# Patient Record
Sex: Female | Born: 1935 | Race: White | Hispanic: No | Smoking: Never smoker
Health system: Southern US, Community
[De-identification: ages and names within clinical notes are randomized; demographics above are authoritative.]

## PROBLEM LIST (undated history)

## (undated) DIAGNOSIS — E7800 Pure hypercholesterolemia, unspecified: Secondary | ICD-10-CM

## (undated) DIAGNOSIS — M549 Dorsalgia, unspecified: Secondary | ICD-10-CM

## (undated) DIAGNOSIS — F419 Anxiety disorder, unspecified: Secondary | ICD-10-CM

## (undated) DIAGNOSIS — M25569 Pain in unspecified knee: Secondary | ICD-10-CM

## (undated) DIAGNOSIS — G894 Chronic pain syndrome: Secondary | ICD-10-CM

## (undated) DIAGNOSIS — F329 Major depressive disorder, single episode, unspecified: Secondary | ICD-10-CM

## (undated) DIAGNOSIS — F32A Depression, unspecified: Secondary | ICD-10-CM

## (undated) DIAGNOSIS — K219 Gastro-esophageal reflux disease without esophagitis: Secondary | ICD-10-CM

## (undated) DIAGNOSIS — F0390 Unspecified dementia without behavioral disturbance: Secondary | ICD-10-CM

## (undated) DIAGNOSIS — M1990 Unspecified osteoarthritis, unspecified site: Secondary | ICD-10-CM

## (undated) DIAGNOSIS — I10 Essential (primary) hypertension: Secondary | ICD-10-CM

## (undated) DIAGNOSIS — R110 Nausea: Secondary | ICD-10-CM

## (undated) HISTORY — PX: TOTAL HIP ARTHROPLASTY: SHX124

## (undated) HISTORY — PX: JOINT REPLACEMENT: SHX530

---

## 2005-01-15 ENCOUNTER — Ambulatory Visit: Payer: Self-pay | Admitting: Family Medicine

## 2005-04-15 ENCOUNTER — Emergency Department: Payer: Self-pay | Admitting: Unknown Physician Specialty

## 2005-04-15 ENCOUNTER — Other Ambulatory Visit: Payer: Self-pay

## 2006-03-29 ENCOUNTER — Ambulatory Visit: Payer: Self-pay | Admitting: Family Medicine

## 2007-06-26 ENCOUNTER — Ambulatory Visit: Payer: Self-pay | Admitting: Family Medicine

## 2007-07-25 ENCOUNTER — Ambulatory Visit: Payer: Self-pay | Admitting: Family Medicine

## 2009-02-18 ENCOUNTER — Ambulatory Visit: Payer: Self-pay | Admitting: Family Medicine

## 2009-04-16 ENCOUNTER — Ambulatory Visit: Payer: Self-pay | Admitting: Family Medicine

## 2009-12-19 ENCOUNTER — Ambulatory Visit: Payer: Self-pay | Admitting: Internal Medicine

## 2010-07-03 ENCOUNTER — Ambulatory Visit: Payer: Self-pay | Admitting: Family Medicine

## 2010-07-11 ENCOUNTER — Ambulatory Visit: Payer: Self-pay | Admitting: Internal Medicine

## 2012-03-30 ENCOUNTER — Ambulatory Visit: Payer: Self-pay | Admitting: Family Medicine

## 2012-04-28 ENCOUNTER — Ambulatory Visit: Payer: Self-pay | Admitting: Family Medicine

## 2013-05-12 ENCOUNTER — Ambulatory Visit: Payer: Self-pay | Admitting: Family Medicine

## 2013-07-19 ENCOUNTER — Ambulatory Visit: Payer: Self-pay | Admitting: Family Medicine

## 2013-07-27 ENCOUNTER — Ambulatory Visit: Payer: Self-pay | Admitting: Family Medicine

## 2018-02-08 ENCOUNTER — Other Ambulatory Visit: Payer: Self-pay | Admitting: Family Medicine

## 2018-02-08 DIAGNOSIS — N950 Postmenopausal bleeding: Secondary | ICD-10-CM

## 2018-02-09 ENCOUNTER — Ambulatory Visit
Admission: RE | Admit: 2018-02-09 | Discharge: 2018-02-09 | Disposition: A | Discharge status: Home / Self Care | Payer: Medicare HMO | Source: Ambulatory Visit | Attending: Family Medicine | Admitting: Family Medicine

## 2018-02-09 ENCOUNTER — Encounter (INDEPENDENT_AMBULATORY_CARE_PROVIDER_SITE_OTHER): Payer: Self-pay

## 2018-02-09 DIAGNOSIS — N950 Postmenopausal bleeding: Secondary | ICD-10-CM | POA: Diagnosis not present

## 2018-05-09 ENCOUNTER — Encounter: Payer: Self-pay | Admitting: Emergency Medicine

## 2018-05-09 ENCOUNTER — Ambulatory Visit: Admit: 2018-05-09 | Payer: Medicare HMO

## 2018-05-09 ENCOUNTER — Other Ambulatory Visit: Payer: Self-pay

## 2018-05-09 ENCOUNTER — Ambulatory Visit
Admission: EM | Admit: 2018-05-09 | Discharge: 2018-05-09 | Disposition: A | Discharge status: Home / Self Care | Payer: Medicare HMO | Attending: Family Medicine | Admitting: Family Medicine

## 2018-05-09 ENCOUNTER — Ambulatory Visit (INDEPENDENT_AMBULATORY_CARE_PROVIDER_SITE_OTHER): Payer: Medicare HMO

## 2018-05-09 DIAGNOSIS — R1084 Generalized abdominal pain: Secondary | ICD-10-CM | POA: Insufficient documentation

## 2018-05-09 DIAGNOSIS — R52 Pain, unspecified: Secondary | ICD-10-CM

## 2018-05-09 DIAGNOSIS — R110 Nausea: Secondary | ICD-10-CM

## 2018-05-09 HISTORY — DX: Essential (primary) hypertension: I10

## 2018-05-09 HISTORY — DX: Chronic pain syndrome: G89.4

## 2018-05-09 HISTORY — DX: Major depressive disorder, single episode, unspecified: F32.9

## 2018-05-09 HISTORY — DX: Depression, unspecified: F32.A

## 2018-05-09 HISTORY — DX: Dorsalgia, unspecified: M54.9

## 2018-05-09 HISTORY — DX: Pure hypercholesterolemia, unspecified: E78.00

## 2018-05-09 HISTORY — DX: Pain in unspecified knee: M25.569

## 2018-05-09 HISTORY — DX: Anxiety disorder, unspecified: F41.9

## 2018-05-09 HISTORY — DX: Unspecified osteoarthritis, unspecified site: M19.90

## 2018-05-09 HISTORY — DX: Gastro-esophageal reflux disease without esophagitis: K21.9

## 2018-05-09 HISTORY — DX: Nausea: R11.0

## 2018-05-09 LAB — COMPREHENSIVE METABOLIC PANEL
ALT: 77 U/L — ABNORMAL HIGH (ref 0–44)
AST: 58 U/L — ABNORMAL HIGH (ref 15–41)
Albumin: 4.2 g/dL (ref 3.5–5.0)
Alkaline Phosphatase: 87 U/L (ref 38–126)
Anion gap: 9 (ref 5–15)
BUN: 12 mg/dL (ref 8–23)
CO2: 25 mmol/L (ref 22–32)
Calcium: 9.4 mg/dL (ref 8.9–10.3)
Chloride: 103 mmol/L (ref 98–111)
Creatinine, Ser: 0.7 mg/dL (ref 0.44–1.00)
GFR calc non Af Amer: >60 mL/min (ref >60)
Glucose, Bld: 147 mg/dL — ABNORMAL HIGH (ref 70–99)
POTASSIUM: 3.8 mmol/L (ref 3.5–5.1)
SODIUM: 137 mmol/L (ref 135–145)
Total Bilirubin: 0.3 mg/dL (ref 0.3–1.2)
Total Protein: 7.7 g/dL (ref 6.5–8.1)

## 2018-05-09 LAB — CBC WITH DIFFERENTIAL/PLATELET
Abs Immature Granulocytes: 0.01 10*3/uL (ref 0.00–0.07)
Basophils Absolute: 0 10*3/uL (ref 0.0–0.1)
Basophils Relative: 0 %
Eosinophils Absolute: 0.1 10*3/uL (ref 0.0–0.5)
Eosinophils Relative: 1 %
HCT: 42 % (ref 36.0–46.0)
Hemoglobin: 14.6 g/dL (ref 12.0–15.0)
IMMATURE GRANULOCYTES: 0 %
Lymphocytes Relative: 25 %
Lymphs Abs: 1.6 10*3/uL (ref 0.7–4.0)
MCH: 32.6 pg (ref 26.0–34.0)
MCHC: 34.8 g/dL (ref 30.0–36.0)
MCV: 93.8 fL (ref 80.0–100.0)
Monocytes Absolute: 0.6 10*3/uL (ref 0.1–1.0)
Monocytes Relative: 9 %
NEUTROS PCT: 65 %
NRBC: 0 % (ref 0.0–0.2)
Neutro Abs: 4.3 10*3/uL (ref 1.7–7.7)
Platelets: 262 10*3/uL (ref 150–400)
RBC: 4.48 MIL/uL (ref 3.87–5.11)
RDW: 12.6 % (ref 11.5–15.5)
WBC: 6.5 10*3/uL (ref 4.0–10.5)

## 2018-05-09 MED ORDER — ONDANSETRON 8 MG PO TBDP
8.0000 mg | ORAL_TABLET | Freq: Once | ORAL | Status: AC
  Administered 2018-05-09: 8 mg via ORAL

## 2018-05-09 MED ORDER — ONDANSETRON 8 MG PO TBDP: 8.0000 mg | ORAL_TABLET | Freq: Three times a day (TID) | ORAL | 0 refills | Status: DC | PRN

## 2018-05-09 NOTE — ED Provider Notes (Signed)
[facility]    CSN: [id number] Arrival date & time: 05/09/18  1212     History   Chief Complaint Chief Complaint  Patient presents with  . Nausea    HPI Candice Baker is a 83 y.o. female.   83 yo female with a c/o nausea for over 1 month. States she has had occasional vomiting as well and has constipation. States her last BM was about 2-3 days ago. Denies any fevers/chills, melena, hematochezia, dysuria.   The history is provided by the patient.    Past Medical History:  Diagnosis Date  . Anxiety   . Arthritis   . Back pain   . Chronic pain syndrome   . Depression   . GERD (gastroesophageal reflux disease)   . Hypercholesterolemia   . Hypertension   . Knee pain   . Nausea     There are no active problems to display for this patient.   Past Surgical History:  Procedure Laterality Date  . TOTAL HIP ARTHROPLASTY      OB History   No obstetric history on file.      Home Medications    Prior to Admission medications   Medication Sig Start Date End Date Taking? Authorizing Provider  amLODipine (NORVASC) 2.5 MG tablet Take by mouth. 10/12/16  Yes [provider]  aspirin EC 81 MG tablet Take by mouth. 07/13/12  Yes [provider]  atorvastatin (LIPITOR) 40 MG tablet  08/23/14  Yes [provider]  Calcium-Vitamin D 600-200 MG-UNIT tablet Take by mouth. 07/19/11  Yes [provider]  citalopram (CELEXA) 20 MG tablet  11/27/13  Yes [provider]  furosemide (LASIX) 20 MG tablet  07/27/13  Yes [provider]  gabapentin (NEURONTIN) 300 MG capsule Take one pill in the morning, one in the afternoon, and 3 at night 02/07/18  Yes [provider]  omeprazole (PRILOSEC) 40 MG capsule  11/22/13  Yes [provider]  diazepam (VALIUM) 5 MG tablet  05/06/18   [provider]  metoprolol succinate (TOPROL-XL) 25 MG 24 hr tablet TK 1/2 T PO D 04/12/18   [provider]    ondansetron (ZOFRAN ODT) 8 MG disintegrating tablet Take 1 tablet (8 mg total) by mouth every 8 (eight) hours as needed. 05/09/18   Payton Mccallum, MD  traMADol Janean Sark) 50 MG tablet  03/23/18   [provider]  traZODone (DESYREL) 50 MG tablet  05/09/18   [provider]    Family History History reviewed. No pertinent family history.  Social History Social History   Tobacco Use  . Smoking status: Never Smoker  . Smokeless tobacco: Never Used  Substance Use Topics  . Alcohol use: Never    Frequency: Never  . Drug use: Never     Allergies   Patient has no known allergies.   Review of Systems Review of Systems   Physical Exam Triage Vital Signs ED Triage Vitals  Enc Vitals Group     BP 05/09/18 1305 (!) 116/99     Pulse Rate 05/09/18 1305 84     Resp 05/09/18 1305 18     Temp 05/09/18 1305 98.2 F (36.8 C)     Temp Source 05/09/18 1305 Oral     SpO2 05/09/18 1305 97 %     Weight 05/09/18 1257 160 lb (72.6 kg)     Height 05/09/18 1257 5\' 5"  (1.651 m)     Head Circumference --  Peak Flow --      Pain Score 05/09/18 1257 0     Pain Loc --      Pain Edu? --      Excl. in GC? --    No data found.  Updated Vital Signs BP (!) 116/99 (BP Location: Right Arm)   Pulse 84   Temp 98.2 F (36.8 C) (Oral)   Resp 18   Ht 5\' 5"  (1.651 m)   Wt 72.6 kg   SpO2 97%   BMI 26.63 kg/m   Visual Acuity Right Eye Distance:   Left Eye Distance:   Bilateral Distance:    Right Eye Near:   Left Eye Near:    Bilateral Near:     Physical Exam Vitals signs and nursing note reviewed.  Constitutional:      General: She is not in acute distress.    Appearance: She is well-developed. She is not toxic-appearing or diaphoretic.  Cardiovascular:     Rate and Rhythm: Normal rate.  Pulmonary:     Effort: Pulmonary effort is normal. No respiratory distress.  Abdominal:     General: Bowel sounds are normal. There is no distension.     Palpations: Abdomen is  soft. There is no mass.     Tenderness: There is abdominal tenderness (mild, diffuse, no rebound or guarding). There is no right CVA tenderness, left CVA tenderness, guarding or rebound.     Hernia: No hernia is present.  Neurological:     Mental Status: She is alert.      UC Treatments / Results  Labs (all labs ordered are listed, but only abnormal results are displayed) Labs Reviewed  COMPREHENSIVE METABOLIC PANEL - Abnormal; Notable for the following components:      Result Value   Glucose, Bld 147 (*)    AST 58 (*)    ALT 77 (*)    All other components within normal limits  CBC WITH DIFFERENTIAL/PLATELET    EKG None  Radiology Dg Abd 2 Views  Result Date: 05/09/2018 CLINICAL DATA:  Constant nausea that  started 1 month prior. EXAM: ABDOMEN - 2 VIEW COMPARISON:  None FINDINGS: No dilated loops of large or small bowel. No pathologic calcifications. No organomegaly. No aggressive osseous lesion. No intraperitoneal free air. IMPRESSION: No bowel obstruction. Electronically Signed   By: Genevive BiStewart  Edmunds M.D.   On: 05/09/2018 14:22    Procedures Procedures (including critical care time)  Medications Ordered in UC Medications  ondansetron (ZOFRAN-ODT) disintegrating tablet 8 mg (8 mg Oral Given 05/09/18 1332)    Initial Impression / Assessment and Plan / UC Course  I have reviewed the triage vital signs and the nursing notes.  Pertinent labs & imaging results that were available during my care of the patient were reviewed by me and considered in my medical decision making (see chart for details).      Final Clinical Impressions(s) / UC Diagnoses   Final diagnoses:  Pain  Nausea  Abdominal pain, diffuse     Discharge Instructions     Miralax Increase water intake Follow up with Primary Care Provider this week    ED Prescriptions    Medication Sig Dispense Auth. Provider   ondansetron (ZOFRAN ODT) 8 MG disintegrating tablet Take 1 tablet (8 mg total) by  mouth every 8 (eight) hours as needed. 6 tablet Payton Mccallumonty, Terilynn Buresh, MD     1. Labs/x-ray results and diagnosis reviewed with patient 2. Given zofran 8mg  odt with improvement 3. rx as  per orders above; reviewed possible side effects, interactions, risks and benefits  3. Recommend supportive treatment as above 4. Follow up with PCP  5. Follow-up here prn    Controlled Substance Prescriptions [place] Controlled Substance Registry consulted? Not Applicable   Payton Mccallumonty, Nareh Matzke, MD 05/09/18 [phone]

## 2018-05-09 NOTE — Discharge Instructions (Signed)
Miralax Increase water intake Follow up with Primary Care Provider this week

## 2018-05-09 NOTE — ED Triage Notes (Signed)
Patient c/o constant nausea that started 1 month ago. States she will vomit sometimes but not often. She reports that she is constipated and her last BM was 2-3 days ago. She states her PCP is out today.

## 2018-12-23 ENCOUNTER — Other Ambulatory Visit: Payer: Self-pay

## 2018-12-23 ENCOUNTER — Ambulatory Visit (INDEPENDENT_AMBULATORY_CARE_PROVIDER_SITE_OTHER): Payer: Medicare HMO

## 2018-12-23 ENCOUNTER — Ambulatory Visit
Admission: EM | Admit: 2018-12-23 | Discharge: 2018-12-23 | Disposition: A | Discharge status: Home / Self Care | Payer: Medicare HMO | Attending: Emergency Medicine | Admitting: Emergency Medicine

## 2018-12-23 ENCOUNTER — Encounter: Payer: Self-pay | Admitting: Emergency Medicine

## 2018-12-23 DIAGNOSIS — M25559 Pain in unspecified hip: Secondary | ICD-10-CM

## 2018-12-23 DIAGNOSIS — R8271 Bacteriuria: Secondary | ICD-10-CM

## 2018-12-23 DIAGNOSIS — M25551 Pain in right hip: Secondary | ICD-10-CM

## 2018-12-23 DIAGNOSIS — R110 Nausea: Secondary | ICD-10-CM

## 2018-12-23 DIAGNOSIS — B373 Candidiasis of vulva and vagina: Secondary | ICD-10-CM

## 2018-12-23 DIAGNOSIS — R1030 Lower abdominal pain, unspecified: Secondary | ICD-10-CM | POA: Diagnosis not present

## 2018-12-23 DIAGNOSIS — B3731 Acute candidiasis of vulva and vagina: Secondary | ICD-10-CM

## 2018-12-23 LAB — URINALYSIS, COMPLETE (UACMP) WITH MICROSCOPIC
Bilirubin Urine: NEGATIVE
Glucose, UA: NEGATIVE mg/dL
Hgb urine dipstick: NEGATIVE
Ketones, ur: NEGATIVE mg/dL
Leukocytes,Ua: NEGATIVE
Nitrite: NEGATIVE
Protein, ur: NEGATIVE mg/dL
RBC / HPF: NONE SEEN RBC/hpf (ref 0–5)
Specific Gravity, Urine: 1.02 (ref 1.005–1.030)
WBC, UA: NONE SEEN WBC/hpf (ref 0–5)
pH: 7.5 (ref 5.0–8.0)

## 2018-12-23 MED ORDER — ONDANSETRON 8 MG PO TBDP: 8.0000 mg | ORAL_TABLET | Freq: Three times a day (TID) | ORAL | 0 refills | Status: DC | PRN

## 2018-12-23 MED ORDER — FLUCONAZOLE 150 MG PO TABS: 150.0000 mg | ORAL_TABLET | Freq: Once | ORAL | 1 refills | Status: AC

## 2018-12-23 NOTE — ED Triage Notes (Signed)
Patient c/o right sided back pain off and on since last night.  Patient reports some nausea.

## 2018-12-23 NOTE — ED Provider Notes (Signed)
HPI  SUBJECTIVE:  Candice Baker is a 83 y.o. female who presents with 2 issues.  First, she reports posterior right upper buttock pain starting last night.  Describes as sore, throbbing, intermittent, lasting hours.  She reports having an erythematous rash which has now turned into "purple spots" starting 1 month ago, however this is not in the exact area of pain.  No fall, trauma.  No bruising.  She reports hip pain with walking, but states that it is not any different than usual.  No fevers, urinary complaints, vaginal discharge, itching.  No abdominal, pelvic pain.  No distal numbness or tingling, leg weakness.  No pain radiating down her leg.  No saddle anesthesia, urinary retention, fecal or urinary incontinence.  She tried Tylenol with some improvement in her symptoms.  Symptoms are worse with walking.  She is status post left total hip, has a history of hypertension, shingles, chronic pain for which she takes Neurontin and the occasional tramadol.  Questionable history of osteoporosis and questionable history peptic ulcer disease, very remote.  No history of chronic kidney disease, GI bleed.  Second, she reports nausea which has been present for "months".  No vomiting.  It is not any different today.  PMD: Dr. Quillian QuinceBliss.    Past Medical History:  Diagnosis Date  . Anxiety   . Arthritis   . Back pain   . Chronic pain syndrome   . Depression   . GERD (gastroesophageal reflux disease)   . Hypercholesterolemia   . Hypertension   . Knee pain   . Nausea     Past Surgical History:  Procedure Laterality Date  . TOTAL HIP ARTHROPLASTY      History reviewed. No pertinent family history.  Social History   Tobacco Use  . Smoking status: Never Smoker  . Smokeless tobacco: Never Used  Substance Use Topics  . Alcohol use: Never    Frequency: Never  . Drug use: Never    No current facility-administered medications for this encounter.   Current Outpatient Medications:  .  amLODipine  (NORVASC) 2.5 MG tablet, Take by mouth., Disp: , Rfl:  .  aspirin EC 81 MG tablet, Take by mouth., Disp: , Rfl:  .  atorvastatin (LIPITOR) 40 MG tablet, , Disp: , Rfl:  .  Calcium-Vitamin D 600-200 MG-UNIT tablet, Take by mouth., Disp: , Rfl:  .  citalopram (CELEXA) 20 MG tablet, , Disp: , Rfl:  .  gabapentin (NEURONTIN) 300 MG capsule, Take one pill in the morning, one in the afternoon, and 3 at night, Disp: , Rfl:  .  metoprolol succinate (TOPROL-XL) 25 MG 24 hr tablet, TK 1/2 T PO D, Disp: , Rfl:  .  omeprazole (PRILOSEC) 40 MG capsule, , Disp: , Rfl:  .  traMADol (ULTRAM) 50 MG tablet, , Disp: , Rfl:  .  traZODone (DESYREL) 50 MG tablet, , Disp: , Rfl:  .  diazepam (VALIUM) 5 MG tablet, , Disp: , Rfl:  .  fluconazole (DIFLUCAN) 150 MG tablet, Take 1 tablet (150 mg total) by mouth once for 1 dose. 1 tab po x 1. May repeat in 72 hours if no improvement, Disp: 2 tablet, Rfl: 1 .  ondansetron (ZOFRAN ODT) 8 MG disintegrating tablet, Take 1 tablet (8 mg total) by mouth every 8 (eight) hours as needed., Disp: 20 tablet, Rfl: 0  No Known Allergies   ROS  As noted in HPI.   Physical Exam  BP (!) 162/58 (BP Location: Left Arm)  Pulse 66   Temp 98.2 F (36.8 C) (Oral)   Resp 14   Ht 5' 5.5" (1.664 m)   Wt 72.6 kg   SpO2 95%   BMI 26.22 kg/m   Constitutional: Well developed, well nourished, no acute distress Eyes:  EOMI, conjunctiva normal bilaterally HENT: Normocephalic, atraumatic,mucus membranes moist Respiratory: Normal inspiratory effort Cardiovascular: Normal rate GI: nondistended.  Mild suprapubic tenderness.  No flank tenderness.  No other abdominal tenderness. Back: Positive tenderness over the right upper buttock near the hip.  No tenderness at the sciatic notch.  No bruising, rash in the area of pain.  Positive tenderness down the IT band, greater trochanter.  No CVA tenderness.- paralumbar tenderness,  - muscle spasm. no L spine or SI joint  tenderness. Bilateral lower  extremities nontender without new rashes or color change, baseline ROM with intact DP pulses. No pain with int/ext rotation flex/extension hips bilaterally. SLR neg. Sensation baseline light touch bilaterally for Pt, DTR's symmetric and intact bilaterally KJ, Motor symmetric bilateral 5/5 hip flexion, quadriceps, hamstrings, EHL, foot dorsiflexion, foot plantarflexion, gait somewhat antalgic but without apparent new ataxia. skin: Healed nontender hyperpigmented rash over right buttock however this is not in the area of pain. Musculoskeletal: no deformities Neurologic: Alert & oriented x 3, no focal neuro deficits Psychiatric: Speech and behavior appropriate   ED Course   Medications - No data to display  Orders Placed This Encounter  Procedures  . Urine culture    Standing Status:   Standing    Number of Occurrences:   1  . DG Hip Unilat With Pelvis 2-3 Views Right    Standing Status:   Standing    Number of Occurrences:   1    Order Specific Question:   Symptom/Reason for Exam    Answer:   Hip pain [[id number]]    Order Specific Question:   Radiology Contrast Protocol - do NOT remove file path    Answer:   [email or web address]  . Urinalysis, Complete w Microscopic    Standing Status:   Standing    Number of Occurrences:   1    Results for orders placed or performed during the hospital encounter of 12/23/18 (from the past 24 hour(s))  Urinalysis, Complete w Microscopic     Status: Abnormal   Collection Time: 12/23/18 10:41 AM  Result Value Ref Range   Color, Urine YELLOW YELLOW   APPearance HAZY (A) CLEAR   Specific Gravity, Urine 1.020 1.005 - 1.030   pH 7.5 5.0 - 8.0   Glucose, UA NEGATIVE NEGATIVE mg/dL   Hgb urine dipstick NEGATIVE NEGATIVE   Bilirubin Urine NEGATIVE NEGATIVE   Ketones, ur NEGATIVE NEGATIVE mg/dL   Protein, ur NEGATIVE NEGATIVE mg/dL   Nitrite NEGATIVE NEGATIVE   Leukocytes,Ua NEGATIVE NEGATIVE   Squamous Epithelial /  LPF 0-5 0 - 5   WBC, UA NONE SEEN 0 - 5 WBC/hpf   RBC / HPF NONE SEEN 0 - 5 RBC/hpf   Bacteria, UA FEW (A) NONE SEEN   Budding Yeast PRESENT    Dg Hip Unilat With Pelvis 2-3 Views Right  Result Date: 12/23/2018 CLINICAL DATA:  Right hip pain EXAM: DG HIP (WITH OR WITHOUT PELVIS) 2-3V RIGHT COMPARISON:  None. FINDINGS: Generalized osteopenia. No fracture or dislocation. No aggressive osseous lesion. Left total hip arthroplasty. IMPRESSION: No acute osseous injury of the right hip. Electronically Signed   By: Kathreen Devoid   On: 12/23/2018 12:20    ED Clinical Impression  1.  Acute right hip pain   2. Hip pain   3. Nausea without vomiting   4. Vaginal yeast infection   5. Bacteriuria   6. Pain of right hip joint      ED Assessment/Plan  Previous records, labs from this clinic and outside ER records reviewed.  Patient was seen here in January for nausea.  Sent home with Zofran per ER record did not work.  She was seen in the ED in the next day for the nausea, no acute cause was found, however was found to have an E. coli UTI urine culture after negative UA.  BUN/creatinine normal as of January 2020  Reviewed imaging independently.  Osteopenia.  No fracture, dislocation.  See radiology report for details.  1.  Right hip pain.  She has tenderness over the upper glutes, lateral hip and at the greater trochanter.  No pain over the spine, SI joint.  No CVA tenderness.  There is no rash suggestive of shingles today although this is in the differential, or bruising.  Because of the questionable history of osteoporosis will x-ray right hip.  If negative, plan to send home with 200 mg of ibuprofen combined with 500 mg of Tylenol 3-4 times a day as needed for pain.  2.  Nausea.  Patient states that this is unchanged, states that she has had this for "a long time".  Records state back from January of this year.  Abdomen benign.  Will send home with Zofran.  She will need to follow-up with her PMD  about this.  Discussed the Zofran with patient.  She does not remember if it worked or not.  She states that promethazine does not work for her.  3.  Suprapubic tenderness, bacteriuria.  We will send this off for culture to confirm absence of UTI.  Could be contributing to her nausea.  4.  Yeast in urine: Patient has no vaginal complaints, however will send home with Diflucan.  Reviewed imaging independently.  Normal hip.  See radiology report for full details.  Discussed labs, imaging, MDM, treatment plan, and plan for follow-up with patient discussed signs and symptoms that should prompt return to the emergency department..patient agrees with plan.   Meds ordered this encounter  Medications  . ondansetron (ZOFRAN ODT) 8 MG disintegrating tablet    Sig: Take 1 tablet (8 mg total) by mouth every 8 (eight) hours as needed.    Dispense:  20 tablet    Refill:  0  . fluconazole (DIFLUCAN) 150 MG tablet    Sig: Take 1 tablet (150 mg total) by mouth once for 1 dose. 1 tab po x 1. May repeat in 72 hours if no improvement    Dispense:  2 tablet    Refill:  1    *This clinic note was created using Scientist, clinical (histocompatibility and immunogenetics). Therefore, there may be occasional mistakes despite careful proofreading.   ?    Domenick Gong, MD 12/23/18 1750

## 2018-12-23 NOTE — Discharge Instructions (Addendum)
Your x-ray was normal other than for some osteopenia, which means that you have slightly thinner bones than normal.  You do not have any fractures or dislocation.  You can try 200 mg of ibuprofen combined with 500 mg of Tylenol 3-4 times a day as needed for pain.  You had yeast in your urine, so I am treating this with Diflucan, or you may use Monistat vaginally.  We are going to try the Zofran again for your nausea.  Please follow-up with your primary care physician about this for further evaluation.  I have sent your urine off for culture to make sure that you do not have a UTI.  We will call you and call in the appropriate antibiotics if your urine comes out positive for UTI.

## 2018-12-25 LAB — URINE CULTURE: Culture: <10000 — AB

## 2020-02-11 ENCOUNTER — Inpatient Hospital Stay: Payer: Medicare HMO | Admitting: Anesthesiology

## 2020-02-11 ENCOUNTER — Encounter
Admission: EM | Disposition: A | Discharge status: Skilled Nursing Facility | Payer: Self-pay | Source: Skilled Nursing Facility | Attending: Hospitalist

## 2020-02-11 ENCOUNTER — Inpatient Hospital Stay: Payer: Medicare HMO

## 2020-02-11 ENCOUNTER — Emergency Department: Payer: Medicare HMO

## 2020-02-11 ENCOUNTER — Other Ambulatory Visit: Payer: Self-pay

## 2020-02-11 ENCOUNTER — Inpatient Hospital Stay
Admission: EM | Admit: 2020-02-11 | Discharge: 2020-02-14 | DRG: 522 | Disposition: A | Discharge status: Skilled Nursing Facility | Payer: Medicare HMO | Source: Skilled Nursing Facility | Attending: Internal Medicine | Admitting: Internal Medicine

## 2020-02-11 DIAGNOSIS — Z66 Do not resuscitate: Secondary | ICD-10-CM | POA: Diagnosis present

## 2020-02-11 DIAGNOSIS — W19XXXA Unspecified fall, initial encounter: Secondary | ICD-10-CM

## 2020-02-11 DIAGNOSIS — D62 Acute posthemorrhagic anemia: Secondary | ICD-10-CM | POA: Diagnosis not present

## 2020-02-11 DIAGNOSIS — Z96642 Presence of left artificial hip joint: Secondary | ICD-10-CM | POA: Diagnosis present

## 2020-02-11 DIAGNOSIS — R5082 Postprocedural fever: Secondary | ICD-10-CM | POA: Diagnosis not present

## 2020-02-11 DIAGNOSIS — Z7982 Long term (current) use of aspirin: Secondary | ICD-10-CM | POA: Diagnosis not present

## 2020-02-11 DIAGNOSIS — F0390 Unspecified dementia without behavioral disturbance: Secondary | ICD-10-CM | POA: Diagnosis present

## 2020-02-11 DIAGNOSIS — R0602 Shortness of breath: Secondary | ICD-10-CM | POA: Diagnosis not present

## 2020-02-11 DIAGNOSIS — S72001K Fracture of unspecified part of neck of right femur, subsequent encounter for closed fracture with nonunion: Secondary | ICD-10-CM | POA: Diagnosis not present

## 2020-02-11 DIAGNOSIS — S72001A Fracture of unspecified part of neck of right femur, initial encounter for closed fracture: Secondary | ICD-10-CM | POA: Diagnosis present

## 2020-02-11 DIAGNOSIS — K219 Gastro-esophageal reflux disease without esophagitis: Secondary | ICD-10-CM | POA: Diagnosis present

## 2020-02-11 DIAGNOSIS — G8918 Other acute postprocedural pain: Secondary | ICD-10-CM

## 2020-02-11 DIAGNOSIS — G894 Chronic pain syndrome: Secondary | ICD-10-CM | POA: Diagnosis not present

## 2020-02-11 DIAGNOSIS — W06XXXA Fall from bed, initial encounter: Secondary | ICD-10-CM | POA: Diagnosis present

## 2020-02-11 DIAGNOSIS — E876 Hypokalemia: Secondary | ICD-10-CM | POA: Diagnosis present

## 2020-02-11 DIAGNOSIS — Z20822 Contact with and (suspected) exposure to covid-19: Secondary | ICD-10-CM | POA: Diagnosis present

## 2020-02-11 DIAGNOSIS — S72011A Unspecified intracapsular fracture of right femur, initial encounter for closed fracture: Secondary | ICD-10-CM | POA: Diagnosis present

## 2020-02-11 DIAGNOSIS — M85851 Other specified disorders of bone density and structure, right thigh: Secondary | ICD-10-CM | POA: Diagnosis present

## 2020-02-11 DIAGNOSIS — M8580 Other specified disorders of bone density and structure, unspecified site: Secondary | ICD-10-CM | POA: Diagnosis present

## 2020-02-11 DIAGNOSIS — I10 Essential (primary) hypertension: Secondary | ICD-10-CM | POA: Diagnosis present

## 2020-02-11 DIAGNOSIS — Z79899 Other long term (current) drug therapy: Secondary | ICD-10-CM

## 2020-02-11 DIAGNOSIS — F32A Depression, unspecified: Secondary | ICD-10-CM | POA: Diagnosis present

## 2020-02-11 HISTORY — PX: HIP ARTHROPLASTY: SHX981

## 2020-02-11 HISTORY — DX: Unspecified dementia, unspecified severity, without behavioral disturbance, psychotic disturbance, mood disturbance, and anxiety: F03.90

## 2020-02-11 LAB — TROPONIN I (HIGH SENSITIVITY)
Troponin I (High Sensitivity): 7 ng/L (ref <18)
Troponin I (High Sensitivity): 7 ng/L (ref <18)

## 2020-02-11 LAB — RESPIRATORY PANEL BY RT PCR (FLU A&B, COVID)
Influenza A by PCR: NEGATIVE
Influenza B by PCR: NEGATIVE
SARS Coronavirus 2 by RT PCR: NEGATIVE

## 2020-02-11 LAB — COMPREHENSIVE METABOLIC PANEL
ALT: 13 U/L (ref 0–44)
AST: 17 U/L (ref 15–41)
Albumin: 3.9 g/dL (ref 3.5–5.0)
Alkaline Phosphatase: 72 U/L (ref 38–126)
Anion gap: 10 (ref 5–15)
BUN: 16 mg/dL (ref 8–23)
CO2: 26 mmol/L (ref 22–32)
Calcium: 9.1 mg/dL (ref 8.9–10.3)
Chloride: 104 mmol/L (ref 98–111)
Creatinine, Ser: 0.62 mg/dL (ref 0.44–1.00)
GFR, Estimated: >60 mL/min (ref >60)
Glucose, Bld: 154 mg/dL — ABNORMAL HIGH (ref 70–99)
Potassium: 3.4 mmol/L — ABNORMAL LOW (ref 3.5–5.1)
Sodium: 140 mmol/L (ref 135–145)
Total Bilirubin: 0.5 mg/dL (ref 0.3–1.2)
Total Protein: 6.8 g/dL (ref 6.5–8.1)

## 2020-02-11 LAB — TYPE AND SCREEN
ABO/RH(D): O POS
Antibody Screen: NEGATIVE

## 2020-02-11 LAB — CBC
HCT: 30.9 % — ABNORMAL LOW (ref 36.0–46.0)
Hemoglobin: 9.5 g/dL — ABNORMAL LOW (ref 12.0–15.0)
MCH: 26.8 pg (ref 26.0–34.0)
MCHC: 30.7 g/dL (ref 30.0–36.0)
MCV: 87 fL (ref 80.0–100.0)
Platelets: 261 10*3/uL (ref 150–400)
RBC: 3.55 MIL/uL — ABNORMAL LOW (ref 3.87–5.11)
RDW: 15.6 % — ABNORMAL HIGH (ref 11.5–15.5)
WBC: 9.9 10*3/uL (ref 4.0–10.5)
nRBC: 0 % (ref 0.0–0.2)

## 2020-02-11 LAB — SURGICAL PCR SCREEN
MRSA, PCR: NEGATIVE
Staphylococcus aureus: NEGATIVE

## 2020-02-11 SURGERY — HEMIARTHROPLASTY, HIP, DIRECT ANTERIOR APPROACH, FOR FRACTURE
Anesthesia: Spinal | Site: Hip | Laterality: Right

## 2020-02-11 MED ORDER — CHLORHEXIDINE GLUCONATE CLOTH 2 % EX PADS: 6.0000 | MEDICATED_PAD | Freq: Every day | CUTANEOUS | Status: AC

## 2020-02-11 MED ORDER — FENTANYL CITRATE (PF) 100 MCG/2ML IJ SOLN: 25.0000 ug | INTRAMUSCULAR | Status: DC | PRN

## 2020-02-11 MED ORDER — METOCLOPRAMIDE HCL 10 MG PO TABS: 5.0000 mg | ORAL_TABLET | Freq: Three times a day (TID) | ORAL | Status: DC | PRN

## 2020-02-11 MED ORDER — MAGNESIUM HYDROXIDE 400 MG/5ML PO SUSP: 30.0000 mL | Freq: Every day | ORAL | Status: DC | PRN

## 2020-02-11 MED ORDER — MAGNESIUM CITRATE PO SOLN
1.0000 | Freq: Once | ORAL | Status: DC | PRN
  Filled 2020-02-11: qty 296

## 2020-02-11 MED ORDER — DIPHENHYDRAMINE HCL 12.5 MG/5ML PO ELIX
12.5000 mg | ORAL_SOLUTION | ORAL | Status: DC | PRN
  Administered 2020-02-13: 12.5 mg via ORAL
  Filled 2020-02-11: qty 5

## 2020-02-11 MED ORDER — BISACODYL 10 MG RE SUPP: 10.0000 mg | Freq: Every day | RECTAL | Status: DC | PRN

## 2020-02-11 MED ORDER — AMLODIPINE BESYLATE 5 MG PO TABS
2.5000 mg | ORAL_TABLET | Freq: Every day | ORAL | Status: DC
  Administered 2020-02-12: 2.5 mg via ORAL
  Filled 2020-02-11: qty 1

## 2020-02-11 MED ORDER — ALUM & MAG HYDROXIDE-SIMETH 200-200-20 MG/5ML PO SUSP: 30.0000 mL | ORAL | Status: DC | PRN

## 2020-02-11 MED ORDER — PANTOPRAZOLE SODIUM 40 MG PO TBEC
40.0000 mg | DELAYED_RELEASE_TABLET | Freq: Every day | ORAL | Status: DC
  Administered 2020-02-12 – 2020-02-14 (×3): 40 mg via ORAL
  Filled 2020-02-11 (×3): qty 1

## 2020-02-11 MED ORDER — HALOPERIDOL LACTATE 5 MG/ML IJ SOLN
2.0000 mg | Freq: Four times a day (QID) | INTRAMUSCULAR | Status: DC | PRN
  Administered 2020-02-11: 2 mg via INTRAVENOUS
  Filled 2020-02-11: qty 1

## 2020-02-11 MED ORDER — DOCUSATE SODIUM 100 MG PO CAPS
100.0000 mg | ORAL_CAPSULE | Freq: Two times a day (BID) | ORAL | Status: DC
  Administered 2020-02-12 – 2020-02-14 (×3): 100 mg via ORAL
  Filled 2020-02-11 (×4): qty 1

## 2020-02-11 MED ORDER — METHOCARBAMOL 500 MG PO TABS: 500.0000 mg | ORAL_TABLET | Freq: Four times a day (QID) | ORAL | Status: DC | PRN

## 2020-02-11 MED ORDER — ACETAMINOPHEN 10 MG/ML IV SOLN
INTRAVENOUS | Status: DC | PRN
  Administered 2020-02-11: 1000 mg via INTRAVENOUS

## 2020-02-11 MED ORDER — METHOCARBAMOL 1000 MG/10ML IJ SOLN
500.0000 mg | Freq: Four times a day (QID) | INTRAVENOUS | Status: DC | PRN
  Filled 2020-02-11: qty 5

## 2020-02-11 MED ORDER — TRAMADOL HCL 50 MG PO TABS
50.0000 mg | ORAL_TABLET | Freq: Four times a day (QID) | ORAL | Status: DC
  Administered 2020-02-12: 50 mg via ORAL
  Filled 2020-02-11 (×2): qty 1

## 2020-02-11 MED ORDER — PROPOFOL 10 MG/ML IV BOLUS
INTRAVENOUS | Status: DC | PRN
  Administered 2020-02-11: 20 mg via INTRAVENOUS
  Administered 2020-02-11 (×2): 30 mg via INTRAVENOUS

## 2020-02-11 MED ORDER — PROPOFOL 500 MG/50ML IV EMUL
INTRAVENOUS | Status: DC | PRN
  Administered 2020-02-11: 50 ug/kg/min via INTRAVENOUS

## 2020-02-11 MED ORDER — GABAPENTIN 300 MG PO CAPS
300.0000 mg | ORAL_CAPSULE | Freq: Three times a day (TID) | ORAL | Status: DC
  Administered 2020-02-11 – 2020-02-14 (×6): 300 mg via ORAL
  Filled 2020-02-11 (×7): qty 1

## 2020-02-11 MED ORDER — LACTATED RINGERS IV SOLN: INTRAVENOUS | Status: DC | PRN

## 2020-02-11 MED ORDER — HYDROCODONE-ACETAMINOPHEN 5-325 MG PO TABS
1.0000 | ORAL_TABLET | ORAL | Status: DC | PRN
  Administered 2020-02-11: 2 via ORAL

## 2020-02-11 MED ORDER — ACETAMINOPHEN 325 MG PO TABS: 325.0000 mg | ORAL_TABLET | Freq: Four times a day (QID) | ORAL | Status: DC | PRN

## 2020-02-11 MED ORDER — BUPIVACAINE HCL (PF) 0.5 % IJ SOLN
INTRAMUSCULAR | Status: DC | PRN
  Administered 2020-02-11: 2.5 mL

## 2020-02-11 MED ORDER — NEOMYCIN-POLYMYXIN B GU 40-200000 IR SOLN
Status: AC
  Filled 2020-02-11: qty 2

## 2020-02-11 MED ORDER — ONDANSETRON HCL 4 MG/2ML IJ SOLN: 4.0000 mg | Freq: Once | INTRAMUSCULAR | Status: DC | PRN

## 2020-02-11 MED ORDER — SODIUM CHLORIDE 0.45 % IV SOLN
INTRAVENOUS | Status: DC
  Filled 2020-02-11 (×2): qty 1000

## 2020-02-11 MED ORDER — CITALOPRAM HYDROBROMIDE 20 MG PO TABS
20.0000 mg | ORAL_TABLET | Freq: Every day | ORAL | Status: DC
  Administered 2020-02-12: 20 mg via ORAL
  Filled 2020-02-11 (×3): qty 1

## 2020-02-11 MED ORDER — ONDANSETRON HCL 4 MG PO TABS: 4.0000 mg | ORAL_TABLET | Freq: Four times a day (QID) | ORAL | Status: DC | PRN

## 2020-02-11 MED ORDER — MORPHINE SULFATE (PF) 4 MG/ML IV SOLN: 4.0000 mg | Freq: Once | INTRAVENOUS | Status: AC

## 2020-02-11 MED ORDER — ADULT MULTIVITAMIN W/MINERALS CH
1.0000 | ORAL_TABLET | Freq: Every day | ORAL | Status: DC
  Administered 2020-02-12 – 2020-02-14 (×3): 1 via ORAL
  Filled 2020-02-11 (×3): qty 1

## 2020-02-11 MED ORDER — ONDANSETRON HCL 4 MG/2ML IJ SOLN: 4.0000 mg | Freq: Four times a day (QID) | INTRAMUSCULAR | Status: DC | PRN

## 2020-02-11 MED ORDER — TRAZODONE HCL 50 MG PO TABS: 50.0000 mg | ORAL_TABLET | Freq: Every evening | ORAL | Status: DC | PRN

## 2020-02-11 MED ORDER — CEFAZOLIN SODIUM-DEXTROSE 1-4 GM/50ML-% IV SOLN
1.0000 g | Freq: Four times a day (QID) | INTRAVENOUS | Status: AC
  Administered 2020-02-11 – 2020-02-12 (×2): 1 g via INTRAVENOUS
  Filled 2020-02-11 (×2): qty 50

## 2020-02-11 MED ORDER — PROPOFOL 10 MG/ML IV BOLUS
INTRAVENOUS | Status: AC
  Filled 2020-02-11: qty 20

## 2020-02-11 MED ORDER — METOCLOPRAMIDE HCL 5 MG/ML IJ SOLN: 5.0000 mg | Freq: Three times a day (TID) | INTRAMUSCULAR | Status: DC | PRN

## 2020-02-11 MED ORDER — CEFAZOLIN SODIUM-DEXTROSE 1-4 GM/50ML-% IV SOLN
1.0000 g | Freq: Once | INTRAVENOUS | Status: AC
  Administered 2020-02-11: 1 g via INTRAVENOUS
  Filled 2020-02-11 (×2): qty 50

## 2020-02-11 MED ORDER — CALCIUM CARBONATE-VITAMIN D 500-200 MG-UNIT PO TABS
1.0000 | ORAL_TABLET | Freq: Every day | ORAL | Status: DC
  Administered 2020-02-12 – 2020-02-14 (×3): 1 via ORAL
  Filled 2020-02-11 (×3): qty 1

## 2020-02-11 MED ORDER — MORPHINE SULFATE (PF) 4 MG/ML IV SOLN
INTRAVENOUS | Status: AC
  Administered 2020-02-11: 4 mg via INTRAVENOUS
  Filled 2020-02-11: qty 1

## 2020-02-11 MED ORDER — DIAZEPAM 5 MG PO TABS
5.0000 mg | ORAL_TABLET | Freq: Two times a day (BID) | ORAL | Status: DC | PRN
  Administered 2020-02-12: 5 mg via ORAL
  Filled 2020-02-11: qty 1

## 2020-02-11 MED ORDER — METOPROLOL SUCCINATE ER 25 MG PO TB24
12.5000 mg | ORAL_TABLET | Freq: Every day | ORAL | Status: DC
  Administered 2020-02-12: 12.5 mg via ORAL
  Filled 2020-02-11: qty 1

## 2020-02-11 MED ORDER — ACETAMINOPHEN 10 MG/ML IV SOLN
INTRAVENOUS | Status: AC
  Filled 2020-02-11: qty 100

## 2020-02-11 MED ORDER — ENOXAPARIN SODIUM 40 MG/0.4ML ~~LOC~~ SOLN
40.0000 mg | SUBCUTANEOUS | Status: DC
  Administered 2020-02-12 – 2020-02-14 (×3): 40 mg via SUBCUTANEOUS
  Filled 2020-02-11 (×3): qty 0.4

## 2020-02-11 MED ORDER — ATORVASTATIN CALCIUM 20 MG PO TABS
40.0000 mg | ORAL_TABLET | Freq: Every day | ORAL | Status: DC
  Administered 2020-02-12 – 2020-02-14 (×3): 40 mg via ORAL
  Filled 2020-02-11 (×3): qty 2

## 2020-02-11 MED ORDER — CEFAZOLIN SODIUM 1 G IJ SOLR
INTRAMUSCULAR | Status: AC
  Filled 2020-02-11: qty 10

## 2020-02-11 MED ORDER — SODIUM CHLORIDE 0.9 % IV SOLN: INTRAVENOUS | Status: DC

## 2020-02-11 MED ORDER — MENTHOL 3 MG MT LOZG
1.0000 | LOZENGE | OROMUCOSAL | Status: DC | PRN
  Filled 2020-02-11: qty 9

## 2020-02-11 MED ORDER — METHOCARBAMOL 1000 MG/10ML IJ SOLN
500.0000 mg | Freq: Four times a day (QID) | INTRAVENOUS | Status: DC | PRN
  Administered 2020-02-12 – 2020-02-13 (×2): 500 mg via INTRAVENOUS
  Filled 2020-02-11 (×3): qty 5

## 2020-02-11 MED ORDER — MORPHINE SULFATE (PF) 2 MG/ML IV SOLN: 0.5000 mg | INTRAVENOUS | Status: DC | PRN

## 2020-02-11 MED ORDER — ENSURE ENLIVE PO LIQD
237.0000 mL | Freq: Two times a day (BID) | ORAL | Status: DC
  Administered 2020-02-12 – 2020-02-13 (×3): 237 mL via ORAL

## 2020-02-11 MED ORDER — ONDANSETRON HCL 4 MG/2ML IJ SOLN
INTRAMUSCULAR | Status: AC
  Administered 2020-02-11: 4 mg via INTRAVENOUS
  Filled 2020-02-11: qty 2

## 2020-02-11 MED ORDER — ONDANSETRON HCL 4 MG/2ML IJ SOLN: 4.0000 mg | INTRAMUSCULAR | Status: AC

## 2020-02-11 MED ORDER — MORPHINE SULFATE (PF) 2 MG/ML IV SOLN
0.5000 mg | INTRAVENOUS | Status: DC | PRN
  Administered 2020-02-12: 0.5 mg via INTRAVENOUS
  Filled 2020-02-11: qty 1

## 2020-02-11 MED ORDER — NEOMYCIN-POLYMYXIN B GU 40-200000 IR SOLN
Status: DC | PRN
  Administered 2020-02-11: 2 mL

## 2020-02-11 MED ORDER — PHENOL 1.4 % MT LIQD
1.0000 | OROMUCOSAL | Status: DC | PRN
  Filled 2020-02-11: qty 177

## 2020-02-11 MED ORDER — BUPIVACAINE-EPINEPHRINE (PF) 0.25% -1:200000 IJ SOLN
INTRAMUSCULAR | Status: AC
  Filled 2020-02-11: qty 30

## 2020-02-11 MED ORDER — ZOLPIDEM TARTRATE 5 MG PO TABS: 5.0000 mg | ORAL_TABLET | Freq: Every evening | ORAL | Status: DC | PRN

## 2020-02-11 MED ORDER — ACETAMINOPHEN 325 MG PO TABS
650.0000 mg | ORAL_TABLET | Freq: Four times a day (QID) | ORAL | Status: DC | PRN
  Filled 2020-02-11: qty 2

## 2020-02-11 MED ORDER — HYDROCODONE-ACETAMINOPHEN 5-325 MG PO TABS
1.0000 | ORAL_TABLET | Freq: Four times a day (QID) | ORAL | Status: DC | PRN
  Filled 2020-02-11: qty 2

## 2020-02-11 MED ORDER — HYDROCODONE-ACETAMINOPHEN 7.5-325 MG PO TABS: 1.0000 | ORAL_TABLET | ORAL | Status: DC | PRN

## 2020-02-11 SURGICAL SUPPLY — 68 items
BAG DECANTER FOR FLEXI CONT (MISCELLANEOUS) ×3 IMPLANT
BLADE SAGITTAL AGGR TOOTH XLG (BLADE) ×3 IMPLANT
BNDG COHESIVE 6X5 TAN STRL LF (GAUZE/BANDAGES/DRESSINGS) ×9 IMPLANT
BOWL CEMENT MIXING ADV NOZZLE (MISCELLANEOUS) ×3 IMPLANT
CANISTER SUCT 1200ML W/VALVE (MISCELLANEOUS) IMPLANT
CANISTER WOUND CARE 500ML ATS (WOUND CARE) ×3 IMPLANT
CEMENT BONE 40GM (Cement) ×6 IMPLANT
CEMENT RESTRICTOR DEPUY SZ 3 (Cement) ×3 IMPLANT
CHLORAPREP W/TINT 26 (MISCELLANEOUS) ×3 IMPLANT
COVER BACK TABLE REUSABLE LG (DRAPES) ×3 IMPLANT
COVER WAND RF STERILE (DRAPES) ×3 IMPLANT
DRAPE 3/4 80X56 (DRAPES) ×9 IMPLANT
DRAPE C-ARM XRAY 36X54 (DRAPES) ×3 IMPLANT
DRAPE INCISE IOBAN 66X60 STRL (DRAPES) IMPLANT
DRAPE POUCH INSTRU U-SHP 10X18 (DRAPES) ×3 IMPLANT
DRESSING PREVENA PLUS CUSTOM (GAUZE/BANDAGES/DRESSINGS) ×1 IMPLANT
DRESSING SURGICEL FIBRLLR 1X2 (HEMOSTASIS) ×2 IMPLANT
DRSG MEPILEX SACRM 8.7X9.8 (GAUZE/BANDAGES/DRESSINGS) ×3 IMPLANT
DRSG OPSITE POSTOP 4X8 (GAUZE/BANDAGES/DRESSINGS) ×6 IMPLANT
DRSG PREVENA PLUS CUSTOM (GAUZE/BANDAGES/DRESSINGS) ×3
DRSG SURGICEL FIBRILLAR 1X2 (HEMOSTASIS) ×6
ELECT BLADE 6.5 EXT (BLADE) ×3 IMPLANT
ELECT REM PT RETURN 9FT ADLT (ELECTROSURGICAL) ×3
ELECTRODE REM PT RTRN 9FT ADLT (ELECTROSURGICAL) ×1 IMPLANT
GLOVE BIOGEL PI IND STRL 9 (GLOVE) ×1 IMPLANT
GLOVE BIOGEL PI INDICATOR 9 (GLOVE) ×2
GLOVE SURG SYN 9.0  PF PI (GLOVE) ×4
GLOVE SURG SYN 9.0 PF PI (GLOVE) ×2 IMPLANT
GOWN SRG 2XL LVL 4 RGLN SLV (GOWNS) ×1 IMPLANT
GOWN STRL NON-REIN 2XL LVL4 (GOWNS) ×2
GOWN STRL REUS W/ TWL LRG LVL3 (GOWN DISPOSABLE) ×1 IMPLANT
GOWN STRL REUS W/TWL LRG LVL3 (GOWN DISPOSABLE) ×2
HEAD BIPOLAR SZ45 HEAD 28 (Head) ×3 IMPLANT
HEMOVAC 400CC 10FR (MISCELLANEOUS) IMPLANT
HIP FEM HD M 28 (Head) ×3 IMPLANT
HIP STEM FEM 2 STD (Stem) ×3 IMPLANT
HOLDER FOLEY CATH W/STRAP (MISCELLANEOUS) ×3 IMPLANT
HOOD PEEL AWAY FLYTE STAYCOOL (MISCELLANEOUS) ×3 IMPLANT
IRRIGATION SURGIPHOR STRL (IV SOLUTION) IMPLANT
KIT PREVENA INCISION MGT 13 (CANNISTER) ×6 IMPLANT
MANIFOLD NEPTUNE II (INSTRUMENTS) ×3 IMPLANT
MAT ABSORB  FLUID 56X50 GRAY (MISCELLANEOUS) ×2
MAT ABSORB FLUID 56X50 GRAY (MISCELLANEOUS) ×1 IMPLANT
NDL SAFETY ECLIPSE 18X1.5 (NEEDLE) ×1 IMPLANT
NEEDLE HYPO 18GX1.5 SHARP (NEEDLE) ×2
NEEDLE SPNL 20GX3.5 QUINCKE YW (NEEDLE) ×6 IMPLANT
NS IRRIG 1000ML POUR BTL (IV SOLUTION) ×3 IMPLANT
PACK HIP COMPR (MISCELLANEOUS) ×3 IMPLANT
PRESSURIZER CEMENT PROX FEM SM (MISCELLANEOUS) ×3 IMPLANT
SCALPEL PROTECTED #10 DISP (BLADE) ×6 IMPLANT
SOL PREP PVP 2OZ (MISCELLANEOUS) ×3
SOLUTION PREP PVP 2OZ (MISCELLANEOUS) ×1 IMPLANT
SPONGE DRAIN TRACH 4X4 STRL 2S (GAUZE/BANDAGES/DRESSINGS) ×3 IMPLANT
STAPLER SKIN PROX 35W (STAPLE) ×3 IMPLANT
STRAP SAFETY 5IN WIDE (MISCELLANEOUS) ×3 IMPLANT
SUT DVC 2 QUILL PDO  T11 36X36 (SUTURE) ×2
SUT DVC 2 QUILL PDO T11 36X36 (SUTURE) ×1 IMPLANT
SUT SILK 0 (SUTURE) ×2
SUT SILK 0 30XBRD TIE 6 (SUTURE) ×1 IMPLANT
SUT V-LOC 90 ABS DVC 3-0 CL (SUTURE) ×3 IMPLANT
SUT VIC AB 1 CT1 36 (SUTURE) ×3 IMPLANT
SYR 20ML LL LF (SYRINGE) ×3 IMPLANT
SYR 30ML LL (SYRINGE) ×3 IMPLANT
SYR 50ML LL SCALE MARK (SYRINGE) ×6 IMPLANT
SYR BULB IRRIG 60ML STRL (SYRINGE) ×3 IMPLANT
TAPE MICROFOAM 4IN (TAPE) ×3 IMPLANT
TOWEL OR 17X26 4PK STRL BLUE (TOWEL DISPOSABLE) ×3 IMPLANT
TRAY FOLEY MTR SLVR 16FR STAT (SET/KITS/TRAYS/PACK) ×3 IMPLANT

## 2020-02-11 NOTE — Progress Notes (Signed)
Initial Nutrition Assessment  RD working remotely.  DOCUMENTATION CODES:   Not applicable  INTERVENTION:   -Once diet is advanced, add:  -Ensure Enlive po BID, each supplement provides 350 kcal and 20 grams of protein -MVI with minerals daily  NUTRITION DIAGNOSIS:   Increased nutrient needs related to post-op healing as evidenced by estimated needs.  GOAL:   Patient will meet greater than or equal to 90% of their needs  MONITOR:   PO intake, Supplement acceptance, Diet advancement, Labs, Weight trends, Skin, I & O's  REASON FOR ASSESSMENT:   Consult Assessment of nutrition requirement/status, Hip fracture protocol  ASSESSMENT:   Candice Baker is a 84 y.o. female with medical history significant for dementia, anxiety, hypertension, GERD who was brought into the emergency room by EMS from evaluate for evaluation of an unwitnessed fall.  Pt admitted with subcpatial fracture of rt femoral neck.   Attempted to speak with pt via call to hospital room phone, however, unable to reach.   Per orthopedics notes, plan for rt hip hemiarthroplasty today. Pt is currently NPO for procedure.   Reviewed wt hx; pt has experienced a 12.5% wt loss over the past year. While this is not significant for time frame, it is concerning given advanced age and multiple co-morbidities.   Pt with increased nutritional needs due to post-operative healing and would benefit from addition of oral nutrition supplements. Pt also at high risk for malnutrition given advanced age, weight los, and cognitive impairment.   Medications reviewed and include calcium with vitamin D.  Labs reviewed.   Diet Order:   Diet Order            Diet NPO time specified  Diet effective now                 EDUCATION NEEDS:   No education needs have been identified at this time  Skin:  Skin Assessment: Reviewed RN Assessment  Last BM:  Unknown  Height:   Ht Readings from Last 1 Encounters:  02/11/20 5\' 5"   (1.651 m)    Weight:   Wt Readings from Last 1 Encounters:  02/11/20 63.5 kg    Ideal Body Weight:  56.8 kg  BMI:  Body mass index is 23.3 kg/m.  Estimated Nutritional Needs:   Kcal:  1700-1900  Protein:  80-95 grams  Fluid:  > 1.7 L    02/13/20, RD, LDN, CDCES Registered Dietitian II Certified Diabetes Care and Education Specialist Please refer to [facility] for RD and/or RD on-call/weekend/after hours pager

## 2020-02-11 NOTE — Anesthesia Preprocedure Evaluation (Signed)
Anesthesia Evaluation  Patient identified by MRN, date of birth, ID band Patient confused    Reviewed: Patient's Chart, lab work & pertinent test results, Unable to perform ROS - Chart review only  History of Anesthesia Complications Negative for: history of anesthetic complications  Airway Mallampati: III       Dental   Pulmonary neg sleep apnea, neg COPD, Not current smoker,           Cardiovascular hypertension, Pt. on medications      Neuro/Psych neg Seizures Anxiety Depression Dementia    GI/Hepatic Neg liver ROS, GERD  Medicated,  Endo/Other  neg diabetes  Renal/GU negative Renal ROS     Musculoskeletal   Abdominal   Peds  Hematology   Anesthesia Other Findings   Reproductive/Obstetrics                             Anesthesia Physical Anesthesia Plan  ASA: III and emergent  Anesthesia Plan: Spinal   Post-op Pain Management:    Induction: Intravenous  PONV Risk Score and Plan: 2  Airway Management Planned: Nasal Cannula  Additional Equipment:   Intra-op Plan:   Post-operative Plan:   Informed Consent: I have reviewed the patients History and Physical, chart, labs and discussed the procedure including the risks, benefits and alternatives for the proposed anesthesia with the patient or authorized representative who has indicated his/her understanding and acceptance.       Plan Discussed with:   Anesthesia Plan Comments:         Anesthesia Quick Evaluation

## 2020-02-11 NOTE — H&P (Signed)
History and Physical    Candice Baker NOB:[id number] DOB: [birth year] DOA: 02/11/2020  PCP: Dortha Kern, MD   Patient coming from: Home I have personally briefly reviewed patient's old medical records in [facility]  Chief Complaint: Right hip pain  History is limited due to patient's underlying dementia  HPI: Candice Baker is a 84 y.o. female with medical history significant for dementia, anxiety, hypertension, GERD who was brought into the emergency room by EMS from evaluate for evaluation of an unwitnessed fall.  Patient states that she rolled out of bed, she was able to get back in bed but now complains of severe pain in her right hip.  Any form of movement aggravates her pain. Labs show sodium 140, potassium 3.4, chloride 104, bicarb 26, BUN 16, creatinine 0.16, calcium 9.1, alkaline phosphatase 72, albumin 3.9, AST 17, ALT 13, total protein 6.8, white count 11.9, hemoglobin 9.5, hematocrit 30.9, MCV 87, RDW 15.6, platelet count 261 Respiratory viral panel is negative Right hip x-ray shows non comminuted, nondisplaced subcapital fracture of the right femoral neck with mild valgus angulation. No other fractures. No Dislocation Twelve-lead EKG reviewed by me shows sinus rhythm, PACs and LVH    ED Course: Patient is an 84 year old nursing home resident who was brought into the ER by EMS after she had an unwitnessed fall.  X-ray shows a nondisplaced subcapital fracture of the right femoral neck.  She will be admitted to the hospital for further evaluation.  Review of Systems: As per HPI otherwise 10 point review of systems negative.    Past Medical History:  Diagnosis Date  . Anxiety   . Arthritis   . Back pain   . Chronic pain syndrome   . Dementia (HCC)   . Depression   . GERD (gastroesophageal reflux disease)   . Hypercholesterolemia   . Hypertension   . Knee pain   . Nausea     Past Surgical History:  Procedure Laterality Date  . TOTAL HIP ARTHROPLASTY        reports that she has never smoked. She has never used smokeless tobacco. She reports that she does not drink alcohol and does not use drugs.  No Known Allergies  No family history on file.   Prior to Admission medications   Medication Sig Start Date End Date Taking? Authorizing Provider  amLODipine (NORVASC) 2.5 MG tablet Take by mouth. 10/12/16   [provider]  aspirin EC 81 MG tablet Take by mouth. 07/13/12   [provider]  atorvastatin (LIPITOR) 40 MG tablet  08/23/14   [provider]  Calcium-Vitamin D 600-200 MG-UNIT tablet Take by mouth. 07/19/11   [provider]  citalopram (CELEXA) 20 MG tablet  11/27/13   [provider]  diazepam (VALIUM) 5 MG tablet  05/06/18   [provider]  gabapentin (NEURONTIN) 300 MG capsule Take one pill in the morning, one in the afternoon, and 3 at night 02/07/18   [provider]  metoprolol succinate (TOPROL-XL) 25 MG 24 hr tablet TK 1/2 T PO D 04/12/18   [provider]  omeprazole (PRILOSEC) 40 MG capsule  11/22/13   [provider]  ondansetron (ZOFRAN ODT) 8 MG disintegrating tablet Take 1 tablet (8 mg total) by mouth every 8 (eight) hours as needed. 12/23/18   Domenick Gong, MD  traMADol Janean Sark) 50 MG tablet  03/23/18   [provider]  traZODone (DESYREL) 50 MG tablet  05/09/18   [provider]  furosemide (LASIX) 20 MG tablet  07/27/13 12/23/18  [provider]    Physical Exam: Vitals:   02/11/20 0537 02/11/20 0615 02/11/20 0630 02/11/20 0700  BP: (!) 184/63 (!) 165/54 (!) 169/100 (!) 144/56  Pulse: 80 78 74 72  Resp: 16 (!) 22 20 13   Temp: 98.2 F (36.8 C)     TempSrc: Oral     SpO2: 92% 91% 100% 98%  Weight:      Height:         Vitals:   02/11/20 0537 02/11/20 0615 02/11/20 0630 02/11/20 0700  BP: (!) 184/63 (!) 165/54 (!) 169/100 (!) 144/56  Pulse: 80 78 74 72  Resp: 16 (!) 22 20 13   Temp: 98.2 F (36.8 C)      TempSrc: Oral     SpO2: 92% 91% 100% 98%  Weight:      Height:        Constitutional: NAD, sleepy but arousable Eyes: PERRL, lids and conjunctivae normal ENMT: Mucous membranes are moist.  Neck: normal, supple, no masses, no thyromegaly Respiratory: clear to auscultation bilaterally, no wheezing, no crackles. Normal respiratory effort. No accessory muscle use.  Cardiovascular: Regular rate and rhythm, no murmurs / rubs / gallops. No extremity edema. 2+ pedal pulses. No carotid bruits.  Abdomen: no tenderness, no masses palpated. No hepatosplenomegaly. Bowel sounds positive.  Musculoskeletal: no clubbing / cyanosis. No joint deformity upper and lower extremities. Shortening of the right lower extremity Skin: no rashes, lesions, ulcers.  Neurologic: Unable to assess Psychiatric: Unable to assess   Labs on Admission: I have personally reviewed following labs and imaging studies  CBC: Recent Labs  Lab 02/11/20 0549  WBC 9.9  HGB 9.5*  HCT 30.9*  MCV 87.0  PLT 261   Basic Metabolic Panel: Recent Labs  Lab 02/11/20 0549  NA 140  K 3.4*  CL 104  CO2 26  GLUCOSE 154*  BUN 16  CREATININE 0.62  CALCIUM 9.1   GFR: Estimated Creatinine Clearance: 48.8 mL/min (by C-G formula based on SCr of 0.62 mg/dL). Liver Function Tests: Recent Labs  Lab 02/11/20 0549  AST 17  ALT 13  ALKPHOS 72  BILITOT 0.5  PROT 6.8  ALBUMIN 3.9   No results for input(s): LIPASE, AMYLASE in the last 168 hours. No results for input(s): AMMONIA in the last 168 hours. Coagulation Profile: No results for input(s): INR, PROTIME in the last 168 hours. Cardiac Enzymes: No results for input(s): CKTOTAL, CKMB, CKMBINDEX, TROPONINI in the last 168 hours. BNP (last 3 results) No results for input(s): PROBNP in the last 8760 hours. HbA1C: No results for input(s): HGBA1C in the last 72 hours. CBG: No results for input(s): GLUCAP in the last 168 hours. Lipid Profile: No results for input(s): CHOL,  HDL, LDLCALC, TRIG, CHOLHDL, LDLDIRECT in the last 72 hours. Thyroid Function Tests: No results for input(s): TSH, T4TOTAL, FREET4, T3FREE, THYROIDAB in the last 72 hours. Anemia Panel: No results for input(s): VITAMINB12, FOLATE, FERRITIN, TIBC, IRON, RETICCTPCT in the last 72 hours. Urine analysis:    Component Value Date/Time   COLORURINE YELLOW 12/23/2018 1041   APPEARANCEUR HAZY (A) 12/23/2018 1041   LABSPEC 1.020 12/23/2018 1041   PHURINE 7.5 12/23/2018 1041   GLUCOSEU NEGATIVE 12/23/2018 1041   HGBUR NEGATIVE 12/23/2018 1041   BILIRUBINUR NEGATIVE 12/23/2018 1041   KETONESUR NEGATIVE 12/23/2018 1041   PROTEINUR NEGATIVE 12/23/2018 1041   NITRITE NEGATIVE 12/23/2018 1041   LEUKOCYTESUR NEGATIVE 12/23/2018 1041  Radiological Exams on Admission: DG Hip Unilat W or Wo Pelvis 2-3 Views Right  Result Date: 02/11/2020 CLINICAL DATA:  Pt coming [facility] via [facility]. Unwitnessed fall. EMS states pt was already up off of floor and laid in bed upon arrival. Shortening observed to right leg EXAM: DG HIP (WITH OR WITHOUT PELVIS) 2-3V RIGHT COMPARISON:  12/23/2018 FINDINGS: Nondisplaced, non comminuted subcapital fracture of the right femoral neck with mild valgus angulation. No other fractures. No bone lesions. Skeletal structures are diffusely demineralized. Left total hip arthroplasty appears well seated and aligned and unchanged from the prior study. Soft tissues are unremarkable. IMPRESSION: 1. Non comminuted, nondisplaced subcapital fracture of the right femoral neck with mild valgus angulation. No other fractures. No dislocation Electronically Signed   By: Amie Portland M.D.   On: 02/11/2020 06:22    EKG: Independently reviewed.  Sinus rhythm PACs and LVH  Assessment/Plan Principal Problem:   Fracture of femoral neck, right, closed (HCC) Active Problems:   Essential hypertension   Dementia (HCC)   GERD (gastroesophageal reflux disease)   Chronic pain syndrome       Subcapital fracture of right femoral neck (Non displaced) Status post fall Immobilize right lower extremity Pain control and muscle relaxants We will request diabetic surgery consult    Hypertension Blood pressure is uncontrolled secondary to pain Continue amlodipine and metoprolol   Depression Continue Escitalopram and trazodone    GERD  Continue PPI   Hypokalemia Supplement potassium   DVT prophylaxis: SCD Code Status: DO NOT RESUSCITATE Family Communication: Greater than 50% of time was spent discussing patient's condition and plan of care with her son Briauna Gilmartin over the phone.  All questions and concerns have been addressed.  He verbalizes understanding and agrees with the plan.  CODE STATUS was discussed and she is a DO NOT RESUSCITATE Disposition Plan: Back to previous home environment Consults called: Orthopedic surgery    Zavannah Deblois MD [facility]     02/11/2020, 8:02 AM

## 2020-02-11 NOTE — Plan of Care (Signed)
  Problem: Education: Goal: Verbalization of understanding the information provided (i.e., activity precautions, restrictions, etc) will improve Outcome: Progressing Goal: Individualized Educational Video(s) Outcome: Progressing   Problem: Activity: Goal: Ability to ambulate and perform ADLs will improve Outcome: Progressing   Problem: Clinical Measurements: Goal: Postoperative complications will be avoided or minimized Outcome: Progressing   Problem: Self-Concept: Goal: Ability to maintain and perform role responsibilities to the fullest extent possible will improve Outcome: Progressing   Problem: Pain Management: Goal: Pain level will decrease Outcome: Progressing   Problem: Education: Goal: Knowledge of the prescribed therapeutic regimen will improve Outcome: Progressing Goal: Understanding of discharge needs will improve Outcome: Progressing Goal: Individualized Educational Video(s) Outcome: Progressing   Problem: Activity: Goal: Ability to avoid complications of mobility impairment will improve Outcome: Progressing Goal: Ability to tolerate increased activity will improve Outcome: Progressing   Problem: Clinical Measurements: Goal: Postoperative complications will be avoided or minimized Outcome: Progressing   Problem: Pain Management: Goal: Pain level will decrease with appropriate interventions Outcome: Progressing   Problem: Skin Integrity: Goal: Will show signs of wound healing Outcome: Progressing   Problem: Education: Goal: Knowledge of General Education information will improve Description: Including pain rating scale, medication(s)/side effects and non-pharmacologic comfort measures Outcome: Progressing   Problem: Health Behavior/Discharge Planning: Goal: Ability to manage health-related needs will improve Outcome: Progressing   Problem: Clinical Measurements: Goal: Ability to maintain clinical measurements within normal limits will  improve Outcome: Progressing Goal: Will remain free from infection Outcome: Progressing Goal: Diagnostic test results will improve Outcome: Progressing Goal: Respiratory complications will improve Outcome: Progressing Goal: Cardiovascular complication will be avoided Outcome: Progressing   Problem: Activity: Goal: Risk for activity intolerance will decrease Outcome: Progressing   Problem: Nutrition: Goal: Adequate nutrition will be maintained Outcome: Progressing   Problem: Coping: Goal: Level of anxiety will decrease Outcome: Progressing   Problem: Elimination: Goal: Will not experience complications related to bowel motility Outcome: Progressing Goal: Will not experience complications related to urinary retention Outcome: Progressing   Problem: Pain Managment: Goal: General experience of comfort will improve Outcome: Progressing

## 2020-02-11 NOTE — ED Notes (Signed)
Patient transported to X-ray 

## 2020-02-11 NOTE — Transfer of Care (Signed)
Immediate Anesthesia Transfer of Care Note  Patient: ELISABET GUTZMER  Procedure(s) Performed: ARTHROPLASTY BIPOLAR HIP (HEMIARTHROPLASTY) (Right Hip)  Patient Location: PACU  Anesthesia Type:Spinal  Level of Consciousness: drowsy and patient cooperative  Airway & Oxygen Therapy: Patient Spontanous Breathing and Patient connected to face mask oxygen  Post-op Assessment: Report given to RN and Post -op Vital signs reviewed and stable  Post vital signs: Reviewed and stable  Last Vitals:  Vitals Value Taken Time  BP 131/49 02/11/20 1507  Temp 37.2 C 02/11/20 1507  Pulse 84 02/11/20 1511  Resp 14 02/11/20 1511  SpO2 100 % 02/11/20 1511  Vitals shown include unvalidated device data.  Last Pain:  Vitals:   02/11/20 0904  TempSrc: Oral         Complications: No complications documented.

## 2020-02-11 NOTE — Op Note (Signed)
02/11/2020  3:10 PM  PATIENT:  Candice Baker  84 y.o. female  PRE-OPERATIVE DIAGNOSIS: Left femoral neck fracture  POST-OPERATIVE DIAGNOSIS:   Same  PROCEDURE:  Procedure(s): ARTHROPLASTY BIPOLAR HIP (HEMIARTHROPLASTY) (Right)  SURGEON: Leitha Schuller, MD  ASSISTANTS: None  ANESTHESIA:   spinal  EBL:  Total I/O In: 750 [I.V.:600; IV Piggyback:150] Out: 275 [Urine:250; Blood:25]  BLOOD ADMINISTERED:none  DRAINS: Incisional wound VAC   LOCAL MEDICATIONS USED:  MARCAINE     SPECIMEN:  Source of Specimen:  Right femoral head and neck  DISPOSITION OF SPECIMEN:  PATHOLOGY  COUNTS:  YES   TOURNIQUET:  * No tourniquets in log *  IMPLANTS: Medacta AMIS 2 standard cemented stem, 3 cement plug, M 28 mm metal head and 45 mm bipolar head  DICTATION: .Dragon Dictation  The patient was brought to the operating room and after spinal anesthesia was obtained patient was placed on the operative table with the ipsilateral foot into the Medacta attachment, contralateral leg on a well-padded table. C-arm was brought in and preop template x-ray taken. After prepping and draping in usual sterile fashion appropriate patient identification and timeout procedures were completed. Anterior approach to the hip was obtained and centered over the greater trochanter and TFL muscle. The subcutaneous tissue was incised hemostasis being achieved by electrocautery. TFL fascia was incised and the muscle retracted laterally deep retractor placed. The lateral femoral circumflex vessels were identified and ligated. The anterior capsule was exposed and a capsulotomy performed. The neck was identified and a femoral neck cut carried out with a saw below the level of the fracture. The head was removed without difficulty and showed mild degenerative changes it sized to a 45 and a 45 trial fit well. . The leg was then externally rotated and ischiofemoral and pubofemoral releases carried out. The femur was sequentially  broached to a size  3, with x-ray showing good position the 2 cemented stem was chosen.  Cement was mixed and after cement plug was placed on the canal cement was pressurized. The  2 standard stem was inserted along with a metal M 28 mm head and 45 mm l bipolar head. The hip was reduced and was stable the wound was thoroughly irrigated with fibrillar placed along the posterior capsule and medial neck. The deep fascia ws closed using a heavy Quill after infiltration of 30 cc of quarter percent Sensorcaine with epinephrine.3-0 V-loc to close the skin with skin staples.  Incisional wound VAC applied and patient was sent to recovery in stable condition.   PLAN OF CARE: Admit to inpatient   PATIENT DISPOSITION:  PACU - hemodynamically stable.

## 2020-02-11 NOTE — ED Notes (Signed)
Advised nurse that patient has assigned bed 

## 2020-02-11 NOTE — Anesthesia Procedure Notes (Signed)
Spinal  Patient location during procedure: OR End time: 02/11/2020 1:48 PM Staffing Performed: anesthesiologist  Anesthesiologist: Gunnar Fusi, MD Resident/CRNA: Jonna Clark, CRNA Preanesthetic Checklist Completed: patient identified, IV checked, site marked, risks and benefits discussed, surgical consent, monitors and equipment checked, pre-op evaluation and timeout performed Spinal Block Patient position: sitting Prep: Betadine Patient monitoring: heart rate, continuous pulse ox, blood pressure and cardiac monitor Approach: midline Location: L4-5 Injection technique: single-shot Needle Needle type: Whitacre and Introducer  Needle gauge: 24 G Needle length: 9 cm Additional Notes Negative paresthesia. Negative blood return. Positive free-flowing CSF. Expiration date of kit checked and confirmed. Patient tolerated procedure well, without complications.

## 2020-02-11 NOTE — ED Notes (Signed)
Report to yessica, rn.

## 2020-02-11 NOTE — ED Provider Notes (Signed)
[facility] Emergency Department Provider Note  ____________________________________________   First MD Initiated Contact with Patient 02/11/20 0559     (approximate)  I have reviewed the triage vital signs and the nursing notes.   HISTORY  Chief Complaint Fall  Level 5 caveat: The patient's history may be limited given a documented history of dementia.  HPI Candice Baker is a 84 y.o. female who presents by EMS from [facility] for evaluation of unwitnessed fall.  The patient says that she rolled out of bed.  She was able to get back in bed but has acute onset of severe pain in her right hip.  It is painful if she holds still but severely painful if she moves the leg at all.  She has no numbness or tingling.  She denies headache, neck pain, chest pain, shortness of breath, nausea, vomiting, and abdominal pain.         Past Medical History:  Diagnosis Date  . Anxiety   . Arthritis   . Back pain   . Chronic pain syndrome   . Dementia (HCC)   . Depression   . GERD (gastroesophageal reflux disease)   . Hypercholesterolemia   . Hypertension   . Knee pain   . Nausea     There are no problems to display for this patient.   Past Surgical History:  Procedure Laterality Date  . TOTAL HIP ARTHROPLASTY      Prior to Admission medications   Medication Sig Start Date End Date Taking? Authorizing Provider  amLODipine (NORVASC) 2.5 MG tablet Take by mouth. 10/12/16   [provider]  aspirin EC 81 MG tablet Take by mouth. 07/13/12   [provider]  atorvastatin (LIPITOR) 40 MG tablet  08/23/14   [provider]  Calcium-Vitamin D 600-200 MG-UNIT tablet Take by mouth. 07/19/11   [provider]  citalopram (CELEXA) 20 MG tablet  11/27/13   [provider]  diazepam (VALIUM) 5 MG tablet  05/06/18   [provider]  gabapentin (NEURONTIN) 300 MG capsule Take one pill in the morning, one in the afternoon, and 3  at night 02/07/18   [provider]  metoprolol succinate (TOPROL-XL) 25 MG 24 hr tablet TK 1/2 T PO D 04/12/18   [provider]  omeprazole (PRILOSEC) 40 MG capsule  11/22/13   [provider]  ondansetron (ZOFRAN ODT) 8 MG disintegrating tablet Take 1 tablet (8 mg total) by mouth every 8 (eight) hours as needed. 12/23/18   Domenick Gong, MD  traMADol Janean Sark) 50 MG tablet  03/23/18   [provider]  traZODone (DESYREL) 50 MG tablet  05/09/18   [provider]  furosemide (LASIX) 20 MG tablet  07/27/13 12/23/18  [provider]    Allergies Patient has no known allergies.  No family history on file.  Social History Social History   Tobacco Use  . Smoking status: Never Smoker  . Smokeless tobacco: Never Used  Vaping Use  . Vaping Use: Never used  Substance Use Topics  . Alcohol use: Never  . Drug use: Never    Review of Systems Level 5 caveat: The patient's history may be limited given a documented history of dementia.  Constitutional: No fever/chills Eyes: No visual changes. ENT: No sore throat. Cardiovascular: Denies chest pain. Respiratory: Denies shortness of breath. Gastrointestinal: No abdominal pain.  No nausea, no vomiting.  No diarrhea.  No constipation. Genitourinary: Negative for dysuria. Musculoskeletal: Right hip  pain. Integumentary: Negative for rash. Neurological: Negative for headaches, focal weakness or numbness.   ____________________________________________   PHYSICAL EXAM:  VITAL SIGNS: ED Triage Vitals  Enc Vitals Group     BP 02/11/20 0537 (!) 184/63     Pulse Rate 02/11/20 0537 80     Resp 02/11/20 0537 16     Temp 02/11/20 0537 98.2 F (36.8 C)     Temp Source 02/11/20 0537 Oral     SpO2 02/11/20 0537 92 %     Weight 02/11/20 0533 63.5 kg (140 lb)     Height 02/11/20 0533 1.651 m (5\' 5" )     Head Circumference --      Peak Flow --      Pain Score --      Pain Loc --      Pain  Edu? --      Excl. in GC? --     Constitutional: Alert and oriented to person and location.  Moderate distress due to pain. Eyes: Conjunctivae are normal.  Head: Atraumatic. Nose: No congestion/rhinnorhea. Mouth/Throat: Patient is wearing a mask. Neck: No stridor.  No meningeal signs.  No tenderness to palpation of the cervical spine and no pain or tenderness with flexion, extension, and rotation of head and neck. Cardiovascular: Normal rate, regular rhythm. Good peripheral circulation. Grossly normal heart sounds. Respiratory: Normal respiratory effort.  No retractions. Gastrointestinal: Soft and nontender. No distention.  Musculoskeletal: Right leg is slightly rotated and shortened.  No gross deformities are obvious but the patient has severe pain with any attempt to move the right leg.  She has tenderness to palpation around the proximal femur. Neurologic:  Normal speech and language. No gross focal neurologic deficits are appreciated.  Skin:  Skin is warm, dry and intact.  ____________________________________________   LABS (all labs ordered are listed, but only abnormal results are displayed)  Labs Reviewed  COMPREHENSIVE METABOLIC PANEL - Abnormal; Notable for the following components:      Result Value   Potassium 3.4 (*)    Glucose, Bld 154 (*)    All other components within normal limits  CBC - Abnormal; Notable for the following components:   RBC 3.55 (*)    Hemoglobin 9.5 (*)    HCT 30.9 (*)    RDW 15.6 (*)    All other components within normal limits  RESPIRATORY PANEL BY RT PCR (FLU A&B, COVID)  TYPE AND SCREEN  TYPE AND SCREEN  TROPONIN I (HIGH SENSITIVITY)  TROPONIN I (HIGH SENSITIVITY)   ____________________________________________  EKG  ED ECG REPORT I, , the attending physician, personally viewed and interpreted this ECG.  Date: 02/11/2020 EKG Time: 5:33 AM Rate: 79 Rhythm: normal sinus rhythm (computer interpreted this is atrial  fibrillation but I see P waves and believe the atrial fibrillation interpretation was due to artifact). QRS Axis: Mild left axis deviation Intervals: normal ST/T Wave abnormalities: Non-specific ST segment / T-wave changes, but no clear evidence of acute ischemia. Narrative Interpretation: no definitive evidence of acute ischemia; does not meet STEMI criteria.   ____________________________________________  RADIOLOGY I, 02/13/2020, personally viewed and evaluated these images (plain radiographs) as part of my medical decision making, as well as reviewing the written report by the radiologist.  ED MD interpretation:  Right subcapital femoral neck fracture  Official radiology report(s): DG Hip Unilat W or Wo Pelvis 2-3 Views Right  Result Date: 02/11/2020 CLINICAL DATA:  Pt coming from [facility] via [facility]. Unwitnessed fall. EMS states  pt was already up off of floor and laid in bed upon arrival. Shortening observed to right leg EXAM: DG HIP (WITH OR WITHOUT PELVIS) 2-3V RIGHT COMPARISON:  12/23/2018 FINDINGS: Nondisplaced, non comminuted subcapital fracture of the right femoral neck with mild valgus angulation. No other fractures. No bone lesions. Skeletal structures are diffusely demineralized. Left total hip arthroplasty appears well seated and aligned and unchanged from the prior study. Soft tissues are unremarkable. IMPRESSION: 1. Non comminuted, nondisplaced subcapital fracture of the right femoral neck with mild valgus angulation. No other fractures. No dislocation Electronically Signed   By: Amie Portlandavid  Ormond M.D.   On: 02/11/2020 06:22    ____________________________________________   PROCEDURES   Procedure(s) performed (including Critical Care):  Procedures   ____________________________________________   INITIAL IMPRESSION / MDM / ASSESSMENT AND PLAN / ED COURSE  As part of my medical decision making, I reviewed the following data within the electronic MEDICAL RECORD NUMBER  Nursing notes reviewed and incorporated, Labs reviewed , EKG interpreted , Old chart reviewed, Radiograph reviewed , Discussed with admitting physician , Discussed with orthopedic surgeon (Dr. Rosita KeaMenz) and Notes from prior ED visits   Differential diagnosis includes, but is not limited to, hip fracture, hip dislocation, musculoskeletal strain, contusion, deep hematoma, pelvic fracture.  No reason at this time to suspect acute infection.  Vital signs are normal except for hypertension.  General blood work is pending but anticipating a femur fracture.  X-rays of the pelvis and femur are pending.  I am providing morphine 4 mg IV and Zofran 4 mg IV for pain and nausea.  I have asked the patient to remain n.p.o. and put in the order.  No evidence of ischemia on EKG and the patient does not report any pain except for to her hip.     Clinical Course as of Feb 11 735  Wynelle LinkSun Feb 11, 2020  0716 I discussed the case by phone with Dr. Rosita KeaMenz and reported the subcapital femoral neck fracture.  He reviewed the images and confirmed that he will operate.  I will contact the hospitalist for admission.   [CF]  [id number] The patient's lab work is all generally reassuring with an essentially normal CBC, comprehensive metabolic panel, and high-sensitivity troponin.  Type and screen is pending.  COVID-19 test is negative.   [CF]  0730 Spoke by phone with the patient's son, Brynda GreathouseRandall.  I updated him about the injury and the plan and he understands and will make himself available to speak with Dr. Rosita KeaMenz later this morning.  I then spoke by phone with Dr. Joylene IgoAgbata with the hospitalist service and she will admit the patient.   [CF]    Clinical Course User Index [CF] Loleta RoseForbach, Elinore Shults, MD     ____________________________________________  FINAL CLINICAL IMPRESSION(S) / ED DIAGNOSES  Final diagnoses:  Fall, initial encounter  Closed fracture of neck of right femur, initial encounter (HCC)  Dementia without behavioral disturbance,  unspecified dementia type (HCC)     MEDICATIONS GIVEN DURING THIS VISIT:  Medications  morphine 4 MG/ML injection 4 mg (4 mg Intravenous Given 02/11/20 0614)  ondansetron (ZOFRAN) injection 4 mg (4 mg Intravenous Given 02/11/20 0615)     ED Discharge Orders    None      *Please note:  Evette DoffingFaye O Routson was evaluated in Emergency Department on 02/11/2020 for the symptoms described in the history of present illness. She was evaluated in the context of the global COVID-19 pandemic, which necessitated consideration that the patient might be  at risk for infection with the SARS-CoV-2 virus that causes COVID-19. Institutional protocols and algorithms that pertain to the evaluation of patients at risk for COVID-19 are in a state of rapid change based on information released by regulatory bodies including the CDC and federal and state organizations. These policies and algorithms were followed during the patient's care in the ED.  Some ED evaluations and interventions may be delayed as a result of limited staffing during and after the pandemic.*  Note:  This document was prepared using Dragon voice recognition software and may include unintentional dictation errors.   Loleta Rose, MD 02/11/20 [id number]

## 2020-02-11 NOTE — ED Triage Notes (Signed)
Pt coming from [facility] via [facility]. Unwitnessed fall. EMS states pt was already up off of floor and laid in bed upon arrival. Shortening observed to right leg. Pt non-weight bearing to right leg.

## 2020-02-11 NOTE — Consult Note (Signed)
Reason for Consult: Right femoral neck fracture Referring Physician: Dr. [name] is an 84 y.o. female.  HPI: Patient is a poor historian but reports falling out of bed at [facility].  I did talk to her son who reports that she had been walking without assistive devices but recently was placed in rehab because of multiple problems.  She has a history of prior left total hip and distal femur fracture with plating and recent wrist fracture.  Those were all treated at [facility].  Apparently she normally is an ambulator but lately has had more difficulty with walking.  Her confusion has been intermittent but he reports it has been worse over the last month or so.  She is unable to give an accurate history.  She complains of severe hip pain.  Past Medical History:  Diagnosis Date  . Anxiety   . Arthritis   . Back pain   . Chronic pain syndrome   . Dementia (HCC)   . Depression   . GERD (gastroesophageal reflux disease)   . Hypercholesterolemia   . Hypertension   . Knee pain   . Nausea     Past Surgical History:  Procedure Laterality Date  . TOTAL HIP ARTHROPLASTY      No family history on file.  Social History:  reports that she has never smoked. She has never used smokeless tobacco. She reports that she does not drink alcohol and does not use drugs.  Allergies: No Known Allergies  Medications: I have reviewed the patient's current medications.  Results for orders placed or performed during the hospital encounter of 02/11/20 (from the past 48 hour(s))  Comprehensive metabolic panel     Status: Abnormal   Collection Time: 02/11/20  5:49 AM  Result Value Ref Range   Sodium 140 135 - 145 mmol/L   Potassium 3.4 (L) 3.5 - 5.1 mmol/L   Chloride 104 98 - 111 mmol/L   CO2 26 22 - 32 mmol/L   Glucose, Bld 154 (H) 70 - 99 mg/dL    Comment: Glucose reference range applies only to samples taken after fasting for at least 8 hours.   BUN 16 8 - 23 mg/dL   Creatinine, Ser 1.30 0.44 -  1.00 mg/dL   Calcium 9.1 8.9 - 86.5 mg/dL   Total Protein 6.8 6.5 - 8.1 g/dL   Albumin 3.9 3.5 - 5.0 g/dL   AST 17 15 - 41 U/L   ALT 13 0 - 44 U/L   Alkaline Phosphatase 72 38 - 126 U/L   Total Bilirubin 0.5 0.3 - 1.2 mg/dL   GFR, Estimated >78 >46 mL/min    Comment: (NOTE) Calculated using the CKD-EPI Creatinine Equation (2021)    Anion gap 10 5 - 15    Comment: Performed at [facility], [address] [place], [place] [address]  CBC     Status: Abnormal   Collection Time: 02/11/20  5:49 AM  Result Value Ref Range   WBC 9.9 4.0 - 10.5 K/uL   RBC 3.55 (L) 3.87 - 5.11 MIL/uL   Hemoglobin 9.5 (L) 12.0 - 15.0 g/dL   HCT 28.4 (L) 36 - 46 %   MCV 87.0 80.0 - 100.0 fL   MCH 26.8 26.0 - 34.0 pg   MCHC 30.7 30.0 - 36.0 g/dL   RDW 13.2 (H) 44.0 - 10.2 %   Platelets 261 150 - 400 K/uL   nRBC 0.0 0.0 - 0.2 %    Comment:  Performed at [facility], [address] [place], [place] [address]  Troponin I (High Sensitivity)     Status: None   Collection Time: 02/11/20  5:49 AM  Result Value Ref Range   Troponin I (High Sensitivity) 7 <18 ng/L    Comment: (NOTE) Elevated high sensitivity troponin I (hsTnI) values and significant  changes across serial measurements may suggest ACS but many other  chronic and acute conditions are known to elevate hsTnI results.  Refer to the "Links" section for chest pain algorithms and additional  guidance. Performed at [facility], [address] [place], [place] [address]   Type and screen [facility]     Status: None (Preliminary result)   Collection Time: 02/11/20  5:49 AM  Result Value Ref Range   ABO/RH(D) PENDING    Antibody Screen PENDING    Sample Expiration      02/14/2020,2359 Performed at [facility], [address] [place], [place] [address]   Respiratory Panel by RT PCR (Flu A&B, Covid) - Nasopharyngeal Swab     Status: None   Collection Time: 02/11/20  6:16 AM    Specimen: Nasopharyngeal Swab  Result Value Ref Range   SARS Coronavirus 2 by RT PCR NEGATIVE NEGATIVE    Comment: (NOTE) SARS-CoV-2 target nucleic acids are NOT DETECTED.  The SARS-CoV-2 RNA is generally detectable in upper respiratoy specimens during the acute phase of infection. The lowest concentration of SARS-CoV-2 viral copies this assay can detect is 131 copies/mL. A negative result does not preclude SARS-Cov-2 infection and should not be used as the sole basis for treatment or other patient management decisions. A negative result may occur with  improper specimen collection/handling, submission of specimen other than nasopharyngeal swab, presence of viral mutation(s) within the areas targeted by this assay, and inadequate number of viral copies (<131 copies/mL). A negative result must be combined with clinical observations, patient history, and epidemiological information. The expected result is Negative.  Fact Sheet for Patients:  https://www.moore.com/  Fact Sheet for Healthcare Providers:  https://www.young.biz/  This test is no t yet approved or cleared by the [place] FDA and  has been authorized for detection and/or diagnosis of SARS-CoV-2 by FDA under an Emergency Use Authorization (EUA). This EUA will remain  in effect (meaning this test can be used) for the duration of the COVID-19 declaration under Section 564(b)(1) of the Act, 21 U.S.C. section 360bbb-3(b)(1), unless the authorization is terminated or revoked sooner.     Influenza A by PCR NEGATIVE NEGATIVE   Influenza B by PCR NEGATIVE NEGATIVE    Comment: (NOTE) The Xpert Xpress SARS-CoV-2/FLU/RSV assay is intended as an aid in  the diagnosis of influenza from Nasopharyngeal swab specimens and  should not be used as a sole basis for treatment. Nasal washings and  aspirates are unacceptable for Xpert Xpress SARS-CoV-2/FLU/RSV  testing.  Fact Sheet for  Patients: https://www.moore.com/  Fact Sheet for Healthcare Providers: https://www.young.biz/  This test is not yet approved or cleared by the [place] FDA and  has been authorized for detection and/or diagnosis of SARS-CoV-2 by  FDA under an Emergency Use Authorization (EUA). This EUA will remain  in effect (meaning this test can be used) for the duration of the  Covid-19 declaration under Section 564(b)(1) of the Act, 21  U.S.C. section 360bbb-3(b)(1), unless the authorization is  terminated or revoked. Performed at [facility], [address], [place], [place] [address]     DG  Hip Unilat W or Wo Pelvis 2-3 Views Right  Result Date: 02/11/2020 CLINICAL DATA:  Pt coming from [facility] via [facility]. Unwitnessed fall. EMS states pt was already up off of floor and laid in bed upon arrival. Shortening observed to right leg EXAM: DG HIP (WITH OR WITHOUT PELVIS) 2-3V RIGHT COMPARISON:  12/23/2018 FINDINGS: Nondisplaced, non comminuted subcapital fracture of the right femoral neck with mild valgus angulation. No other fractures. No bone lesions. Skeletal structures are diffusely demineralized. Left total hip arthroplasty appears well seated and aligned and unchanged from the prior study. Soft tissues are unremarkable. IMPRESSION: 1. Non comminuted, nondisplaced subcapital fracture of the right femoral neck with mild valgus angulation. No other fractures. No dislocation Electronically Signed   By: Amie Portland M.D.   On: 02/11/2020 06:22    Review of Systems Blood pressure (!) 144/56, pulse 72, temperature 98.2 F (36.8 C), temperature source Oral, resp. rate 13, height 5\' 5"  (1.651 m), weight 63.5 kg, SpO2 98 %. Physical Exam She has slight flexion to the right hip with pain with any motion.  Distally her pulses are palpable and she is able flex extend the toes and reports she can feel light touch. Radiographic review reveals impacted  subcapital right femoral neck fracture with significant osteopenia Assessment/Plan: Impression is subcapital hip fracture in the presence of severe osteopenia Plan: I discussed with her son the 2 options of pinning versus hemiarthroplasty.  With her being so subcapital there being a very little screw fixation into the head and with her confusion I do not think should be able to maintain partial weightbearing and I think hemiarthroplasty which should be final surgery for this hip if we do a pinning him afraid it will fail and she will come back after prolonged immobilization.  We will plan on right hip hemiarthroplasty later today.  Risks discussed.  02/11/2020, 8:12 AM

## 2020-02-12 ENCOUNTER — Encounter: Payer: Self-pay | Admitting: Orthopedic Surgery

## 2020-02-12 ENCOUNTER — Other Ambulatory Visit: Payer: Self-pay

## 2020-02-12 LAB — CBC
HCT: 34 % — ABNORMAL LOW (ref 36.0–46.0)
Hemoglobin: 10.3 g/dL — ABNORMAL LOW (ref 12.0–15.0)
MCH: 26.9 pg (ref 26.0–34.0)
MCHC: 30.3 g/dL (ref 30.0–36.0)
MCV: 88.8 fL (ref 80.0–100.0)
Platelets: 150 10*3/uL (ref 150–400)
RBC: 3.83 MIL/uL — ABNORMAL LOW (ref 3.87–5.11)
RDW: 16 % — ABNORMAL HIGH (ref 11.5–15.5)
WBC: 7.9 10*3/uL (ref 4.0–10.5)
nRBC: 0 % (ref 0.0–0.2)

## 2020-02-12 LAB — BASIC METABOLIC PANEL
Anion gap: 10 (ref 5–15)
BUN: 15 mg/dL (ref 8–23)
CO2: 24 mmol/L (ref 22–32)
Calcium: 8.5 mg/dL — ABNORMAL LOW (ref 8.9–10.3)
Chloride: 105 mmol/L (ref 98–111)
Creatinine, Ser: 0.63 mg/dL (ref 0.44–1.00)
GFR, Estimated: >60 mL/min (ref >60)
Glucose, Bld: 142 mg/dL — ABNORMAL HIGH (ref 70–99)
Potassium: 3.4 mmol/L — ABNORMAL LOW (ref 3.5–5.1)
Sodium: 139 mmol/L (ref 135–145)

## 2020-02-12 MED ORDER — ACETAMINOPHEN 650 MG RE SUPP
650.0000 mg | RECTAL | Status: DC | PRN
  Administered 2020-02-12 (×2): 650 mg via RECTAL
  Filled 2020-02-12 (×2): qty 1

## 2020-02-12 MED ORDER — HYDROCODONE-ACETAMINOPHEN 5-325 MG PO TABS
1.0000 | ORAL_TABLET | Freq: Four times a day (QID) | ORAL | Status: DC | PRN
  Administered 2020-02-12 – 2020-02-14 (×4): 1 via ORAL
  Filled 2020-02-12 (×4): qty 1

## 2020-02-12 MED ORDER — POTASSIUM CHLORIDE CRYS ER 20 MEQ PO TBCR
40.0000 meq | EXTENDED_RELEASE_TABLET | Freq: Once | ORAL | Status: AC
  Administered 2020-02-12: 40 meq via ORAL
  Filled 2020-02-12: qty 2

## 2020-02-12 NOTE — Progress Notes (Signed)
PT Cancellation Note  Patient Details Name: Candice Baker MRN: [id number] DOB: 1936/02/26   Cancelled Treatment:    Reason Eval/Treat Not Completed: Fatigue/lethargy limiting ability to participate.  PT consult received.  Chart reviewed.  Pt sleeping in bed upon PT arrival; OT present; pt's daughter present.  Per discussion with pt's daughter, will hold PT at this time (d/t pt's lethargy/current status) and re-attempt PT evaluation this afternoon.  Hendricks Limes, PT 02/12/20, 11:30 AM

## 2020-02-12 NOTE — Evaluation (Signed)
Physical Therapy Evaluation Patient Details Name: Candice Baker MRN: [id number] DOB: October 11, 1935 Today's Date: 02/12/2020   History of Present Illness  Pt is an 84 y.o. female presenting to hospital 10/31 s/p unwitnessed fall (pt reports rolling OOB) with R hip pain.  Pt s/p R hemiarthroplasty secondary R femoral neck fx.  PMH includes dementia, anxiety, back pain, chronic pain syndrome, dementia, depression, htn, knee pain, THA L, distal femur fx with plating, wrist fx 2020.  Clinical Impression  Prior to hospital admission, pt was recently having more difficulty walking (using walker or wheelchair); lives at ALF.  Pt sleeping upon PT arrival but woken with vc's; able to state first and last name; inconsistent with yes/no questions; intermittently opening her eyes during session with cueing.  Pt able to assist some with B LE's ex's (limited R LE ROM d/t R hip pain).  Pt did not initiate to assist with any bed mobility so OOB mobility deferred.  Pt would benefit from skilled PT to address noted impairments and functional limitations (see below for any additional details).  Upon hospital discharge, pt would benefit from STR.  Currently pt will be QD frequency but will monitor pt's progress and adjust as appropriate.    Follow Up Recommendations SNF    Equipment Recommendations  Other (comment) (TBD at next facility)    Recommendations for Other Services OT consult     Precautions / Restrictions Precautions Precautions: Fall Precaution Comments: Direct anterior; wound vac Restrictions Weight Bearing Restrictions: Yes RLE Weight Bearing: Weight bearing as tolerated      Mobility  Bed Mobility Overal bed mobility: Needs Assistance Bed Mobility: Rolling Rolling: Total assist         General bed mobility comments: attempted rolling L and R in bed but pt unable to initiate to help with max cueing    Transfers                 General transfer comment: deferred secondary to  pain  Ambulation/Gait                Stairs            Wheelchair Mobility    Modified Rankin (Stroke Patients Only)       Balance                                             Pertinent Vitals/Pain Pain Assessment: Faces Faces Pain Scale:  (0/10 at rest; 6/10 with R LE movement) Pain Location: R LE Pain Descriptors / Indicators: Grimacing;Discomfort;Guarding Pain Intervention(s): Limited activity within patient's tolerance;Monitored during session;Repositioned  HR around 94 bpm and O2 sats 94% on 4 L O2 via nasal cannula.    Home Living Family/patient expects to be discharged to:: Skilled nursing facility Living Arrangements: Other (Comment) (Pt lives at ALF)   Type of Home: Assisted living Home Access: Level entry     Home Layout: One level Home Equipment: Walker - 2 wheels;Bedside commode;Wheelchair - manual Additional Comments: Per OT eval "Caregiver reports pt living in ALF but reports her needing increased assistance. She uses wheelchair on occassion as well."    Prior Function Level of Independence: Needs assistance   Gait / Transfers Assistance Needed: Per OT eval "Functional transfers with RW and uses wheelchair as needed."  Per chart pt was ambulating without AD but recently receiving rehab and having more  difficulty walking.  ADL's / Homemaking Assistance Needed: Per OT eval "Pt needing increased assistance with self care tasks. She has been wearing brief secondary to incontinence."        Hand Dominance   Dominant Hand: Right    Extremity/Trunk Assessment   Upper Extremity Assessment Upper Extremity Assessment: Generalized weakness;Difficult to assess due to impaired cognition    Lower Extremity Assessment Lower Extremity Assessment: Difficult to assess due to impaired cognition (at least 3/5 hip flexion L)    Cervical / Trunk Assessment Cervical / Trunk Assessment: Normal  Communication   Communication: No  difficulties  Cognition Arousal/Alertness: Lethargic Behavior During Therapy: Flat affect Overall Cognitive Status: No family/caregiver present to determine baseline cognitive functioning                                 General Comments: Pt able to state name (first and last) but not DOB.      General Comments General comments (skin integrity, edema, etc.): R hip wound vac in place    Exercises Total Joint Exercises Heel Slides: AAROM;Strengthening;Both;Supine (10 reps x2) Hip ABduction/ADduction: AAROM;Strengthening;Both;10 reps;Supine (minimal ROM B per pt comfort)   Assessment/Plan    PT Assessment Patient needs continued PT services  PT Problem List Decreased strength;Decreased activity tolerance;Decreased balance;Decreased mobility;Decreased knowledge of use of DME;Decreased knowledge of precautions;Pain;Decreased skin integrity       PT Treatment Interventions DME instruction;Gait training;Functional mobility training;Therapeutic activities;Therapeutic exercise;Balance training;Patient/family education    PT Goals (Current goals can be found in the Care Plan section)  Acute Rehab PT Goals Patient Stated Goal: to have less pain PT Goal Formulation: With patient Time For Goal Achievement: 02/26/20 Potential to Achieve Goals: Good    Frequency 7X/week   Barriers to discharge Decreased caregiver support      Co-evaluation               AM-PAC PT "6 Clicks" Mobility  Outcome Measure Help needed turning from your back to your side while in a flat bed without using bedrails?: Total Help needed moving from lying on your back to sitting on the side of a flat bed without using bedrails?: Total Help needed moving to and from a bed to a chair (including a wheelchair)?: Total Help needed standing up from a chair using your arms (e.g., wheelchair or bedside chair)?: Total Help needed to walk in hospital room?: Total Help needed climbing 3-5 steps with a  railing? : Total 6 Click Score: 6    End of Session Equipment Utilized During Treatment: Gait belt Activity Tolerance: Patient limited by pain Patient left: in bed;with call bell/phone within reach;with bed alarm set;with SCD's reapplied;Other (comment) (B heels floating via pillow support) Nurse Communication: Mobility status;Precautions;Weight bearing status PT Visit Diagnosis: Other abnormalities of gait and mobility (R26.89);Muscle weakness (generalized) (M62.81);History of falling (Z91.81);Difficulty in walking, not elsewhere classified (R26.2);Pain Pain - Right/Left: Right Pain - part of body: Hip    Time: 1410-1430 PT Time Calculation (min) (ACUTE ONLY): 20 min   Charges:   PT Evaluation $PT Eval Low Complexity: 1 Low PT Treatments $Therapeutic Exercise: 8-22 mins       Hendricks Limes, PT 02/12/20, 3:04 PM

## 2020-02-12 NOTE — Progress Notes (Signed)
   Subjective: 1 Day Post-Op Procedure(s) (LRB): ARTHROPLASTY BIPOLAR HIP (HEMIARTHROPLASTY) (Right) Patient sleeping this morning.  Responds to touch but unable to answer questions. We will continue therapy today.    Objective: Vital signs in last 24 hours: Temp:  [97.7 F (36.5 C)-102.2 F (39 C)] 97.7 F (36.5 C) (11/01 0329) Pulse Rate:  [79-97] 90 (11/01 0329) Resp:  [14-21] 20 (11/01 0329) BP: (103-170)/(40-74) 170/61 (11/01 0329) SpO2:  [90 %-100 %] 93 % (11/01 0700)  Intake/Output from previous day: 10/31 0701 - 11/01 0700 In: 1210.3 [I.V.:1010.3; IV Piggyback:200] Out: 475 [Urine:450; Blood:25] Intake/Output this shift: No intake/output data recorded.  Recent Labs    02/11/20 0549 02/12/20 0348  HGB 9.5* 10.3*   Recent Labs    02/11/20 0549 02/12/20 0348  WBC 9.9 7.9  RBC 3.55* 3.83*  HCT 30.9* 34.0*  PLT 261 150   Recent Labs    02/11/20 0549 02/12/20 0348  NA 140 139  K 3.4* 3.4*  CL 104 105  CO2 26 24  BUN 16 15  CREATININE 0.62 0.63  GLUCOSE 154* 142*  CALCIUM 9.1 8.5*   No results for input(s): LABPT, INR in the last 72 hours.  EXAM General - Patient is Confused Extremity - No cellulitis present Compartment soft Dressing - dressing C/D/I and no drainage Praveena intact without drainage Motor Function -unable to follow commands in regards to motor function.  Past Medical History:  Diagnosis Date  . Anxiety   . Arthritis   . Back pain   . Chronic pain syndrome   . Dementia (HCC)   . Depression   . GERD (gastroesophageal reflux disease)   . Hypercholesterolemia   . Hypertension   . Knee pain   . Nausea     Assessment/Plan:   1 Day Post-Op Procedure(s) (LRB): ARTHROPLASTY BIPOLAR HIP (HEMIARTHROPLASTY) (Right) Principal Problem:   Fracture of femoral neck, right, closed (HCC) Active Problems:   Essential hypertension   Dementia (HCC)   GERD (gastroesophageal reflux disease)   Chronic pain syndrome    Hypokalemia  Estimated body mass index is 23.3 kg/m as calculated from the following:   Height as of this encounter: 5\' 5"  (1.651 m).   Weight as of this encounter: 63.5 kg. Advance diet Up with therapy  Patient is a difficult historian.  At baseline.  Patient does not appear to be in severe pain. Vital signs are stable Hemoglobin 10.3, stable. Care management to assist with discharge   DVT Prophylaxis - Lovenox, TED hose and SCDs Weight-Bearing as tolerated to right leg   T. , PA-C [facility] 02/12/2020, 8:10 AM

## 2020-02-12 NOTE — Evaluation (Signed)
Occupational Therapy Evaluation Patient Details Name: Candice Baker MRN: [id number] DOB: [birth year] Today's Date: 02/12/2020    History of Present Illness 84 y.o. female with medical history significant for dementia, anxiety, hypertension, GERD who was brought into the emergency room by EMS from evaluate for evaluation of an unwitnessed fall.  Patient states that she rolled out of bed, she was able to get back in bed but now complains of severe pain in her right hip.  Any form of movement aggravates her pain.   Clinical Impression   Patient presenting with decreased I in self care, balance, functional mobility/transfers, endurance, and safety awareness. Pt very lethargic during this session. Patient's daughter, Lupita Leash, reports pt living at ALF PTA with use of RW at baseline. Daughter also reports pt "not doing well at ALF" and needing increased assistance.  Patient currently functioning at total A secondary to lethargy and pain. PT crying out when moved/repositioned in bed.  Patient will benefit from acute OT to increase overall independence in the areas of ADLs, functional mobility, and safety awareness in order to safely discharge to next venue of care.    Follow Up Recommendations  SNF;Supervision/Assistance - 24 hour    Equipment Recommendations  Other (comment) (defer to next venue of care)    Recommendations for Other Services       Precautions / Restrictions Precautions Precautions: Fall Precaution Comments: direct anterior - no hip precautions      Mobility Bed Mobility Overal bed mobility: Needs Assistance Bed Mobility: Rolling Rolling: Total assist     Transfers    General transfer comment: deferred secondary to pain        ADL either performed or assessed with clinical judgement   ADL Overall ADL's : Needs assistance/impaired      General ADL Comments: Pt did initiate washing face but unable to initiate any other movement. Total A and pt calling out with  repositioning. Total A for all aspects of self care at this time secondary to pain, cognition, and lethargy.     Vision Patient Visual Report: No change from baseline              Pertinent Vitals/Pain Pain Assessment: Faces Faces Pain Scale: Hurts whole lot Pain Location: R LE Pain Descriptors / Indicators: Grimacing;Discomfort;Guarding Pain Intervention(s): Limited activity within patient's tolerance;Premedicated before session;Repositioned;Monitored during session     Hand Dominance Right   Extremity/Trunk Assessment Upper Extremity Assessment Upper Extremity Assessment: Generalized weakness   Lower Extremity Assessment Lower Extremity Assessment: Defer to PT evaluation       Communication Communication Communication: No difficulties   Cognition Arousal/Alertness: Lethargic Behavior During Therapy: Flat affect Overall Cognitive Status: History of cognitive impairments - at baseline    General Comments: Her daughter is present in the room who reports on "bad day" this is what her confusion is. Pt unable to answer questions or tell therapist who her daughter was in the room. Pt was able to verbalize her name only. Pt follows 1 step commands with increased time 25% of the time.              Home Living Family/patient expects to be discharged to:: Skilled nursing facility Living Arrangements: Other (Comment) (Pt lives in ALF)   Type of Home: Assisted living Home Access: Level entry     Home Layout: One level     Bathroom Shower/Tub: Producer, television/film/video: Handicapped height     Home Equipment: Environmental consultant - 2 wheels;Bedside commode;Wheelchair -  manual   Additional Comments: Caregiver reports pt living in ALF but reports her needing increased assistance. She uses wheelchair on occassion as well.      Prior Functioning/Environment Level of Independence: Needs assistance  Gait / Transfers Assistance Needed: Functional transfers with RW and uses  wheelchair as needed. ADL's / Homemaking Assistance Needed: Pt needing increased assistance with self care tasks. She has been wearing brief secondary to incontience.            OT Problem List: Decreased strength;Decreased cognition;Decreased activity tolerance;Decreased safety awareness;Decreased knowledge of use of DME or AE;Impaired balance (sitting and/or standing);Pain      OT Treatment/Interventions: Self-care/ADL training;Therapeutic exercise;Therapeutic activities;Energy conservation;Patient/family education;DME and/or AE instruction;Balance training;Cognitive remediation/compensation    OT Goals(Current goals can be found in the care plan section) Acute Rehab OT Goals Patient Stated Goal: to get better OT Goal Formulation: With patient/family Time For Goal Achievement: 02/26/20 Potential to Achieve Goals: Fair ADL Goals Pt Will Perform Grooming: with min assist;standing Pt Will Transfer to Toilet: with min assist;ambulating;bedside commode Pt Will Perform Toileting - Clothing Manipulation and hygiene: with min assist;sit to/from stand  OT Frequency: Min 1X/week   Barriers to D/C:    none at this time          AM-PAC OT "6 Clicks" Daily Activity     Outcome Measure Help from another person eating meals?: A Lot Help from another person taking care of personal grooming?: A Lot Help from another person toileting, which includes using toliet, bedpan, or urinal?: Total Help from another person bathing (including washing, rinsing, drying)?: Total Help from another person to put on and taking off regular upper body clothing?: Total Help from another person to put on and taking off regular lower body clothing?: Total 6 Click Score: 8   End of Session Nurse Communication: Mobility status;Precautions  Activity Tolerance: Patient limited by pain;Patient limited by lethargy Patient left: in bed;with call bell/phone within reach;with family/visitor present;with bed alarm  set  OT Visit Diagnosis: Repeated falls (R29.6);Unsteadiness on feet (R26.81);Muscle weakness (generalized) (M62.81);History of falling (Z91.81);Pain Pain - Right/Left: Right Pain - part of body: Hip                Time: 5643-3295 OT Time Calculation (min): 20 min Charges:  OT General Charges $OT Visit: 1 Visit OT Evaluation $OT Eval Moderate Complexity: 1 Mod OT Treatments $Self Care/Home Management : 8-22 mins  Jackquline Denmark, MS, OTR/L , CBIS ascom [phone]  02/12/20, 1:18 PM

## 2020-02-12 NOTE — [place] FL2 (Signed)
[place] MEDICAID FL2 LEVEL OF CARE SCREENING TOOL     IDENTIFICATION  Patient Name: Candice Baker Birthdate: 11/28/1935 Sex: female Admission Date (Current Location): 02/11/2020  [place] and [place] Number:  Chiropodist and Address:  [facility], [address] [place], [place] [address]      Provider Number: [id number]  Attending Physician Name and Address:  Darlin Priestly, MD  Relative Name and Phone Number:  Bitha Fauteux [phone]    Current Level of Care: Hospital Recommended Level of Care: Skilled Nursing Facility Prior Approval Number:    Date Approved/Denied:   PASRR Number: [id number]  Discharge Plan: SNF    Current Diagnoses: Patient Active Problem List   Diagnosis Date Noted  . Fracture of femoral neck, right, closed (HCC) 02/11/2020  . Essential hypertension 02/11/2020  . Hypokalemia 02/11/2020  . Dementia (HCC)   . GERD (gastroesophageal reflux disease)   . Chronic pain syndrome     Orientation RESPIRATION BLADDER Height & Weight     Self  Normal External catheter Weight: 63.5 kg Height:  5\' 5"  (165.1 cm)  BEHAVIORAL SYMPTOMS/MOOD NEUROLOGICAL BOWEL NUTRITION STATUS      Continent Diet (Regular)  AMBULATORY STATUS COMMUNICATION OF NEEDS Skin   Extensive Assist Verbally Normal                       Personal Care Assistance Level of Assistance  Bathing, Feeding, Dressing Bathing Assistance: Maximum assistance Feeding assistance: Limited assistance Dressing Assistance: Maximum assistance     Functional Limitations Info  Sight, Hearing, Speech Sight Info: Adequate Hearing Info: Adequate Speech Info: Adequate    SPECIAL CARE FACTORS FREQUENCY  PT (By licensed PT), OT (By licensed OT)                    Contractures Contractures Info: Not present    Additional Factors Info  Code Status, Allergies Code Status Info: DNR Allergies Info: No known allergies           Current Medications  (02/12/2020):  This is the current hospital active medication list Current Facility-Administered Medications  Medication Dose Route Frequency Provider Last Rate Last Admin  . 0.9 %  sodium chloride infusion   Intravenous Continuous 13/04/2019, MD 80 mL/hr at 02/12/20 1317 New Bag at 02/12/20 1317  . acetaminophen (TYLENOL) suppository 650 mg  650 mg Rectal Q4H PRN 13/01/21, NP   650 mg at 02/12/20 1011  . acetaminophen (TYLENOL) tablet 650 mg  650 mg Oral Q6H PRN 13/01/21, NP      . alum & mag hydroxide-simeth (MAALOX/MYLANTA) 200-200-20 MG/5ML suspension 30 mL  30 mL Oral Q4H PRN 10-20-2000, MD      . amLODipine (NORVASC) tablet 2.5 mg  2.5 mg Oral Daily Kennedy Bucker, MD   2.5 mg at 02/12/20 1018  . atorvastatin (LIPITOR) tablet 40 mg  40 mg Oral Daily 13/01/21, MD   40 mg at 02/12/20 1020  . bisacodyl (DULCOLAX) suppository 10 mg  10 mg Rectal Daily PRN 13/01/21, MD      . calcium-vitamin D (OSCAL WITH D) 500-200 MG-UNIT per tablet 1 tablet  1 tablet Oral Daily Kennedy Bucker, MD   1 tablet at 02/12/20 1017  . citalopram (CELEXA) tablet 20 mg  20 mg Oral Daily 13/01/21, MD   20 mg at 02/12/20 1017  . diazepam (VALIUM) tablet 5 mg  5 mg Oral BID PRN  Lucile Shutters, MD      . diphenhydrAMINE (BENADRYL) 12.5 MG/5ML elixir 12.5-25 mg  12.5-25 mg Oral Q4H PRN Kennedy Bucker, MD      . docusate sodium (COLACE) capsule 100 mg  100 mg Oral BID Kennedy Bucker, MD   100 mg at 02/12/20 1027  . enoxaparin (LOVENOX) injection 40 mg  40 mg Subcutaneous Q24H Kennedy Bucker, MD   40 mg at 02/12/20 1028  . feeding supplement (ENSURE ENLIVE / ENSURE PLUS) liquid 237 mL  237 mL Oral BID BM Kennedy Bucker, MD   237 mL at 02/12/20 1027  . gabapentin (NEURONTIN) capsule 300 mg  300 mg Oral TID Kennedy Bucker, MD   300 mg at 02/12/20 1020  . haloperidol lactate (HALDOL) injection 2 mg  2 mg Intravenous Q6H PRN Agbata, Tochukwu, MD   2 mg at 02/11/20 1848  .  HYDROcodone-acetaminophen (NORCO/VICODIN) 5-325 MG per tablet 1 tablet  1 tablet Oral Q6H PRN Darlin Priestly, MD      . magnesium citrate solution 1 Bottle  1 Bottle Oral Once PRN Kennedy Bucker, MD      . magnesium hydroxide (MILK OF MAGNESIA) suspension 30 mL  30 mL Oral Daily PRN Kennedy Bucker, MD      . menthol-cetylpyridinium (CEPACOL) lozenge 3 mg  1 lozenge Oral PRN Kennedy Bucker, MD       Or  . phenol (CHLORASEPTIC) mouth spray 1 spray  1 spray Mouth/Throat PRN Kennedy Bucker, MD      . methocarbamol (ROBAXIN) tablet 500 mg  500 mg Oral Q6H PRN Kennedy Bucker, MD       Or  . methocarbamol (ROBAXIN) 500 mg in dextrose 5 % 50 mL IVPB  500 mg Intravenous Q6H PRN Kennedy Bucker, MD 100 mL/hr at 02/12/20 0424 500 mg at 02/12/20 0424  . metoCLOPramide (REGLAN) tablet 5-10 mg  5-10 mg Oral Q8H PRN Kennedy Bucker, MD       Or  . metoCLOPramide (REGLAN) injection 5-10 mg  5-10 mg Intravenous Q8H PRN Kennedy Bucker, MD      . metoprolol succinate (TOPROL-XL) 24 hr tablet 12.5 mg  12.5 mg Oral Daily Kennedy Bucker, MD   12.5 mg at 02/12/20 1018  . multivitamin with minerals tablet 1 tablet  1 tablet Oral Daily Kennedy Bucker, MD   1 tablet at 02/12/20 1017  . ondansetron (ZOFRAN) tablet 4 mg  4 mg Oral Q6H PRN Kennedy Bucker, MD       Or  . ondansetron [facility]) injection 4 mg  4 mg Intravenous Q6H PRN Kennedy Bucker, MD      . pantoprazole (PROTONIX) EC tablet 40 mg  40 mg Oral Daily Kennedy Bucker, MD   40 mg at 02/12/20 1028     Discharge Medications: Please see discharge summary for a list of discharge medications.  Relevant Imaging Results:  Relevant Lab Results:   Additional Information [id number]  Trenton Founds, RN

## 2020-02-12 NOTE — TOC Initial Note (Signed)
Transition of Care [facility]) - Initial/Assessment Note    Patient Details  Name: Candice Baker MRN: [id number] Date of Birth: Dec 07, 1935  Transition of Care [facility] CM/SW Contact:    Trenton Founds, RN Phone Number: 02/12/2020, 4:22 PM  Clinical Narrative:    RNCM reached out to and left message for patient's daughter Lupita Leash by telephone. She called back and left VM requesting this CM reach out to patient's granddaughter Judeth Cornfield to discuss plan. RNCM reached out to granddaughter to discuss, she reports that patient has recently moved to [facility] but they understand she now needs to go to SNF and that she has been working on long term Medicaid for patient. Granddaughter requests that bed requests be sent out in the [place], [place], [place] area and reports that she has been to [facility] in [place] before. RNCM verified PASSR, completed FL-2 and started bed search.              Expected Discharge Plan: Skilled Nursing Facility Barriers to Discharge: No Barriers Identified   Patient Goals and CMS Choice        Expected Discharge Plan and Services Expected Discharge Plan: Skilled Nursing Facility     Post Acute Care Choice: Skilled Nursing Facility Living arrangements for the past 2 months: Single Family Home                                      Prior Living Arrangements/Services Living arrangements for the past 2 months: Single Family Home Lives with:: Facility Resident                   Activities of Daily Living Home Assistive Devices/Equipment: Environmental consultant (specify type) ADL Screening (condition at time of admission) Patient's cognitive ability adequate to safely complete daily activities?: No Is the patient deaf or have difficulty hearing?: Yes Does the patient have difficulty seeing, even when wearing glasses/contacts?: No Does the patient have difficulty concentrating, remembering, or making decisions?: Yes Patient able to express need for assistance with ADLs?:  No Does the patient have difficulty dressing or bathing?: Yes Independently performs ADLs?: No Communication: Appropriate for developmental age, Needs assistance Dressing (OT): Dependent Is this a change from baseline?: Change from baseline, expected to last <3days Grooming: Dependent Is this a change from baseline?: Change from baseline, expected to last <3 days Feeding: Needs assistance Is this a change from baseline?: Change from baseline, expected to last <3 days Bathing: Dependent Is this a change from baseline?: Change from baseline, expected to last <3 days Toileting: Needs assistance Is this a change from baseline?: Change from baseline, expected to last <3 days In/Out Bed: Dependent Is this a change from baseline?: Change from baseline, expected to last <3 days Walks in Home: Dependent Is this a change from baseline?: Change from baseline, expected to last <3 days Does the patient have difficulty walking or climbing stairs?: Yes Weakness of Legs: Right Weakness of Arms/Hands: Both  Permission Sought/Granted                  Emotional Assessment       Orientation: : Oriented to Self Alcohol / Substance Use: Not Applicable Psych Involvement: No (comment)  Admission diagnosis:  Fracture of femoral neck, right, closed (HCC) [S72.001A] Closed fracture of neck of right femur, initial encounter (HCC) [S72.001A] Fall, initial encounter [W19.XXXA] Dementia without behavioral disturbance, unspecified dementia type (HCC) [F03.90] Patient Active  Problem List   Diagnosis Date Noted  . Fracture of femoral neck, right, closed (HCC) 02/11/2020  . Essential hypertension 02/11/2020  . Hypokalemia 02/11/2020  . Dementia (HCC)   . GERD (gastroesophageal reflux disease)   . Chronic pain syndrome    PCP:  Dortha Kern, MD Pharmacy:   [facility] [id number] [facility] [place] - [address] AT [facility] [address] & [address] [facility] [place] [address] Phone:  [phone] Fax: [phone]     Social Determinants of Health (SDOH) Interventions    Readmission Risk Interventions No flowsheet data found.

## 2020-02-12 NOTE — Anesthesia Postprocedure Evaluation (Signed)
Anesthesia Post Note  Patient: Candice Baker  Procedure(s) Performed: ARTHROPLASTY BIPOLAR HIP (HEMIARTHROPLASTY) (Right Hip)  Patient location during evaluation: Nursing Unit Anesthesia Type: Spinal Level of consciousness: awake and alert Pain management: pain level controlled Vital Signs Assessment: post-procedure vital signs reviewed and stable Respiratory status: spontaneous breathing and respiratory function stable Cardiovascular status: blood pressure returned to baseline and stable Postop Assessment: no headache, no backache, no apparent nausea or vomiting and patient able to bend at knees Anesthetic complications: no   No complications documented.   Last Vitals:  Vitals:   02/12/20 0329 02/12/20 0700  BP: (!) 170/61   Pulse: 90   Resp: 20   Temp: 36.5 C   SpO2: 93% 93%    Last Pain:  Vitals:   02/12/20 0329  TempSrc: Oral                 Starling Manns

## 2020-02-12 NOTE — Progress Notes (Signed)
PROGRESS NOTE    Candice Baker  EXH:[id number] DOB: 1935-06-04 DOA: 02/11/2020 PCP: Dortha Kern, MD    Assessment & Plan:   Principal Problem:   Fracture of femoral neck, right, closed (HCC) Active Problems:   Essential hypertension   Dementia (HCC)   GERD (gastroesophageal reflux disease)   Chronic pain syndrome   Hypokalemia    Candice Baker is a 84 y.o. female with medical history significant for dementia, anxiety, hypertension, GERD who was brought into the emergency room by EMS from evaluate for evaluation of an unwitnessed fall.  Patient stated that she rolled out of bed, she was able to get back in bed but now complained of severe pain in her right hip.   Subcapital fracture of right femoral neck (Non displaced) S/p right ARTHROPLASTY on 02/11/20 --PT/OT --Pain control, though minimize due to severe somnolence  Post-op fever --temp up to 102.2 after surgery --blood cx x2 today  --Monitor for now  Somnolence --likely due to pain meds, muscle relaxant, and haldol received over the last 24 hours. --minimize sedating medications --Hold oral intake until pt is awake enough for safe swallow --cont MIVF@80  for now  Hypertension --BP varied widely, sometime very high likely due to pain --cont home amlodipine and metop  Depression --on Escitalopram and trazodone at home --cont Escitalopram when safe to take oral --Hold trazodone due to somnolence  GERD  Continue PPI  Hypokalemia --replete PRN   DVT prophylaxis: Lovenox SQ Code Status: DNR  Family Communication: daughter updated at bedside today Status is: inpatient Dispo:   The patient is from: assisted living Anticipated d/c is to: likely SNF Anticipated d/c date is: 2-3 days Patient currently is not medically stable to d/c due to: somnolence, need to monitor for fever   Subjective and Interval History:  Pt was very somnolent this morning, arousable, but fell easily back to sleep.      Objective: Vitals:   02/12/20 1029 02/12/20 1228 02/12/20 1500 02/12/20 1542  BP: (!) 155/59 (!) 128/50  (!) 137/51  Pulse: 96 86  90  Resp:  17  18  Temp:  99 F (37.2 C)  98.4 F (36.9 C)  TempSrc:  Oral  Oral  SpO2:  97% 96% 96%  Weight:      Height:        Intake/Output Summary (Last 24 hours) at 02/12/2020 1750 Last data filed at 02/12/2020 1615 Gross per 24 hour  Intake 1486.19 ml  Output 200 ml  Net 1286.19 ml   Filed Weights   02/11/20 0533  Weight: 63.5 kg    Examination:   Constitutional: NAD, somnolent, arousable but easily fell back to sleep CV: No cyanosis.   RESP: normal respiratory effort Extremities: No effusions, edema in BLE SKIN: warm, dry and intact   Data Reviewed: I have personally reviewed following labs and imaging studies  CBC: Recent Labs  Lab 02/11/20 0549 02/12/20 0348  WBC 9.9 7.9  HGB 9.5* 10.3*  HCT 30.9* 34.0*  MCV 87.0 88.8  PLT 261 150   Basic Metabolic Panel: Recent Labs  Lab 02/11/20 0549 02/12/20 0348  NA 140 139  K 3.4* 3.4*  CL 104 105  CO2 26 24  GLUCOSE 154* 142*  BUN 16 15  CREATININE 0.62 0.63  CALCIUM 9.1 8.5*   GFR: Estimated Creatinine Clearance: 48.8 mL/min (by C-G formula based on SCr of 0.63 mg/dL). Liver Function Tests: Recent Labs  Lab 02/11/20 0549  AST 17  ALT 13  ALKPHOS 72  BILITOT 0.5  PROT 6.8  ALBUMIN 3.9   No results for input(s): LIPASE, AMYLASE in the last 168 hours. No results for input(s): AMMONIA in the last 168 hours. Coagulation Profile: No results for input(s): INR, PROTIME in the last 168 hours. Cardiac Enzymes: No results for input(s): CKTOTAL, CKMB, CKMBINDEX, TROPONINI in the last 168 hours. BNP (last 3 results) No results for input(s): PROBNP in the last 8760 hours. HbA1C: No results for input(s): HGBA1C in the last 72 hours. CBG: No results for input(s): GLUCAP in the last 168 hours. Lipid Profile: No results for input(s): CHOL, HDL, LDLCALC, TRIG,  CHOLHDL, LDLDIRECT in the last 72 hours. Thyroid Function Tests: No results for input(s): TSH, T4TOTAL, FREET4, T3FREE, THYROIDAB in the last 72 hours. Anemia Panel: No results for input(s): VITAMINB12, FOLATE, FERRITIN, TIBC, IRON, RETICCTPCT in the last 72 hours. Sepsis Labs: No results for input(s): PROCALCITON, LATICACIDVEN in the last 168 hours.  Recent Results (from the past 240 hour(s))  Respiratory Panel by RT PCR (Flu A&B, Covid) - Nasopharyngeal Swab     Status: None   Collection Time: 02/11/20  6:16 AM   Specimen: Nasopharyngeal Swab  Result Value Ref Range Status   SARS Coronavirus 2 by RT PCR NEGATIVE NEGATIVE Final    Comment: (NOTE) SARS-CoV-2 target nucleic acids are NOT DETECTED.  The SARS-CoV-2 RNA is generally detectable in upper respiratoy specimens during the acute phase of infection. The lowest concentration of SARS-CoV-2 viral copies this assay can detect is 131 copies/mL. A negative result does not preclude SARS-Cov-2 infection and should not be used as the sole basis for treatment or other patient management decisions. A negative result may occur with  improper specimen collection/handling, submission of specimen other than nasopharyngeal swab, presence of viral mutation(s) within the areas targeted by this assay, and inadequate number of viral copies (<131 copies/mL). A negative result must be combined with clinical observations, patient history, and epidemiological information. The expected result is Negative.  Fact Sheet for Patients:  https://www.moore.com/  Fact Sheet for Healthcare Providers:  https://www.young.biz/  This test is no t yet approved or cleared by the [place] FDA and  has been authorized for detection and/or diagnosis of SARS-CoV-2 by FDA under an Emergency Use Authorization (EUA). This EUA will remain  in effect (meaning this test can be used) for the duration of the COVID-19 declaration  under Section 564(b)(1) of the Act, 21 U.S.C. section 360bbb-3(b)(1), unless the authorization is terminated or revoked sooner.     Influenza A by PCR NEGATIVE NEGATIVE Final   Influenza B by PCR NEGATIVE NEGATIVE Final    Comment: (NOTE) The Xpert Xpress SARS-CoV-2/FLU/RSV assay is intended as an aid in  the diagnosis of influenza from Nasopharyngeal swab specimens and  should not be used as a sole basis for treatment. Nasal washings and  aspirates are unacceptable for Xpert Xpress SARS-CoV-2/FLU/RSV  testing.  Fact Sheet for Patients: https://www.moore.com/  Fact Sheet for Healthcare Providers: https://www.young.biz/  This test is not yet approved or cleared by the [place] FDA and  has been authorized for detection and/or diagnosis of SARS-CoV-2 by  FDA under an Emergency Use Authorization (EUA). This EUA will remain  in effect (meaning this test can be used) for the duration of the  Covid-19 declaration under Section 564(b)(1) of the Act, 21  U.S.C. section 360bbb-3(b)(1), unless the authorization is  terminated or revoked. Performed at [facility], [address], [place], [place] [address]  Surgical pcr screen     Status: None   Collection Time: 02/11/20  9:13 AM   Specimen: Nasal Mucosa; Nasal Swab  Result Value Ref Range Status   MRSA, PCR NEGATIVE NEGATIVE Final   Staphylococcus aureus NEGATIVE NEGATIVE Final    Comment: (NOTE) The Xpert SA Assay (FDA approved for NASAL specimens in patients 2 years of age and older), is one component of a comprehensive surveillance program. It is not intended to diagnose infection nor to guide or monitor treatment. Performed at [facility], [address] [place], [place] [address]       Radiology Studies: DG HIP OPERATIVE UNILAT WITH PELVIS RIGHT  Result Date: 02/11/2020 CLINICAL DATA:  RIGHT total hip arthroplasty. Fluoro time 6 seconds. EXAM: DG HIP  (WITH OR WITHOUT PELVIS) 2-3V RIGHT; OPERATIVE RIGHT HIP WITH PELVIS COMPARISON:  02/11/2020 FINDINGS: Images are performed prior to and following placement of RIGHT hip hemiarthroplasty. The hardware appears well seated. Hip is normally located. No interval fractures. Remote LEFT hip arthroplasty and LEFT femur ORIF. IMPRESSION: RIGHT hip hemiarthroplasty. Electronically Signed   By: Norva Pavlov M.D.   On: 02/11/2020 16:04   DG HIP UNILAT W OR W/O PELVIS 2-3 VIEWS RIGHT  Result Date: 02/11/2020 CLINICAL DATA:  RIGHT total hip arthroplasty. Fluoro time 6 seconds. EXAM: DG HIP (WITH OR WITHOUT PELVIS) 2-3V RIGHT; OPERATIVE RIGHT HIP WITH PELVIS COMPARISON:  02/11/2020 FINDINGS: Images are performed prior to and following placement of RIGHT hip hemiarthroplasty. The hardware appears well seated. Hip is normally located. No interval fractures. Remote LEFT hip arthroplasty and LEFT femur ORIF. IMPRESSION: RIGHT hip hemiarthroplasty. Electronically Signed   By: Norva Pavlov M.D.   On: 02/11/2020 16:04   DG Hip Unilat W or Wo Pelvis 2-3 Views Right  Result Date: 02/11/2020 CLINICAL DATA:  Pt coming [facility] via [facility]. Unwitnessed fall. EMS states pt was already up off of floor and laid in bed upon arrival. Shortening observed to right leg EXAM: DG HIP (WITH OR WITHOUT PELVIS) 2-3V RIGHT COMPARISON:  12/23/2018 FINDINGS: Nondisplaced, non comminuted subcapital fracture of the right femoral neck with mild valgus angulation. No other fractures. No bone lesions. Skeletal structures are diffusely demineralized. Left total hip arthroplasty appears well seated and aligned and unchanged from the prior study. Soft tissues are unremarkable. IMPRESSION: 1. Non comminuted, nondisplaced subcapital fracture of the right femoral neck with mild valgus angulation. No other fractures. No dislocation Electronically Signed   By: Amie Portland M.D.   On: 02/11/2020 06:22     Scheduled Meds: . amLODipine  2.5  mg Oral Daily  . atorvastatin  40 mg Oral Daily  . calcium-vitamin D  1 tablet Oral Daily  . citalopram  20 mg Oral Daily  . docusate sodium  100 mg Oral BID  . enoxaparin (LOVENOX) injection  40 mg Subcutaneous Q24H  . feeding supplement  237 mL Oral BID BM  . gabapentin  300 mg Oral TID  . metoprolol succinate  12.5 mg Oral Daily  . multivitamin with minerals  1 tablet Oral Daily  . pantoprazole  40 mg Oral Daily   Continuous Infusions: . sodium chloride 80 mL/hr at 02/12/20 1317  . methocarbamol (ROBAXIN) IV 500 mg (02/12/20 0424)     LOS: 1 day     Darlin Priestly, MD [facility] If 7PM-7AM, please contact night-coverage 02/12/2020, 5:50 PM

## 2020-02-13 LAB — MAGNESIUM: Magnesium: 1.8 mg/dL (ref 1.7–2.4)

## 2020-02-13 LAB — BASIC METABOLIC PANEL
Anion gap: 7 (ref 5–15)
BUN: 14 mg/dL (ref 8–23)
CO2: 26 mmol/L (ref 22–32)
Calcium: 8.4 mg/dL — ABNORMAL LOW (ref 8.9–10.3)
Chloride: 104 mmol/L (ref 98–111)
Creatinine, Ser: 0.55 mg/dL (ref 0.44–1.00)
GFR, Estimated: >60 mL/min (ref >60)
Glucose, Bld: 155 mg/dL — ABNORMAL HIGH (ref 70–99)
Potassium: 3.1 mmol/L — ABNORMAL LOW (ref 3.5–5.1)
Sodium: 137 mmol/L (ref 135–145)

## 2020-02-13 LAB — CBC
HCT: 23.7 % — ABNORMAL LOW (ref 36.0–46.0)
Hemoglobin: 7.4 g/dL — ABNORMAL LOW (ref 12.0–15.0)
MCH: 27 pg (ref 26.0–34.0)
MCHC: 31.2 g/dL (ref 30.0–36.0)
MCV: 86.5 fL (ref 80.0–100.0)
Platelets: 173 10*3/uL (ref 150–400)
RBC: 2.74 MIL/uL — ABNORMAL LOW (ref 3.87–5.11)
RDW: 15.9 % — ABNORMAL HIGH (ref 11.5–15.5)
WBC: 6.5 10*3/uL (ref 4.0–10.5)
nRBC: 0 % (ref 0.0–0.2)

## 2020-02-13 MED ORDER — METOPROLOL SUCCINATE ER 25 MG PO TB24
25.0000 mg | ORAL_TABLET | Freq: Every day | ORAL | Status: DC
  Administered 2020-02-13 – 2020-02-14 (×2): 25 mg via ORAL
  Filled 2020-02-13 (×2): qty 1

## 2020-02-13 MED ORDER — AMLODIPINE BESYLATE 10 MG PO TABS
10.0000 mg | ORAL_TABLET | Freq: Every day | ORAL | Status: DC
  Administered 2020-02-13 – 2020-02-14 (×2): 10 mg via ORAL
  Filled 2020-02-13 (×2): qty 1

## 2020-02-13 MED ORDER — MAGNESIUM SULFATE 2 GM/50ML IV SOLN
2.0000 g | Freq: Once | INTRAVENOUS | Status: AC
  Administered 2020-02-13: 2 g via INTRAVENOUS
  Filled 2020-02-13: qty 50

## 2020-02-13 MED ORDER — FLUOXETINE HCL 20 MG PO CAPS
20.0000 mg | ORAL_CAPSULE | Freq: Every day | ORAL | Status: DC
  Administered 2020-02-13 – 2020-02-14 (×2): 20 mg via ORAL
  Filled 2020-02-13 (×2): qty 1

## 2020-02-13 MED ORDER — ENOXAPARIN SODIUM 40 MG/0.4ML ~~LOC~~ SOLN: 40.0000 mg | SUBCUTANEOUS | 0 refills | Status: AC

## 2020-02-13 MED ORDER — FE FUMARATE-B12-VIT C-FA-IFC PO CAPS
1.0000 | ORAL_CAPSULE | Freq: Two times a day (BID) | ORAL | Status: DC
  Administered 2020-02-13 – 2020-02-14 (×2): 1 via ORAL
  Filled 2020-02-13 (×4): qty 1

## 2020-02-13 MED ORDER — HYDROCHLOROTHIAZIDE 12.5 MG PO CAPS: 12.5000 mg | ORAL_CAPSULE | Freq: Every day | ORAL | Status: DC

## 2020-02-13 MED ORDER — HYDROCODONE-ACETAMINOPHEN 5-325 MG PO TABS: 1.0000 | ORAL_TABLET | Freq: Four times a day (QID) | ORAL | 0 refills | Status: AC | PRN

## 2020-02-13 MED ORDER — HYDROCHLOROTHIAZIDE 12.5 MG PO CAPS
25.0000 mg | ORAL_CAPSULE | Freq: Every day | ORAL | Status: DC
  Administered 2020-02-13 – 2020-02-14 (×2): 25 mg via ORAL
  Filled 2020-02-13 (×2): qty 2

## 2020-02-13 MED ORDER — POTASSIUM CHLORIDE CRYS ER 20 MEQ PO TBCR
40.0000 meq | EXTENDED_RELEASE_TABLET | Freq: Once | ORAL | Status: AC
  Administered 2020-02-13: 40 meq via ORAL
  Filled 2020-02-13: qty 2

## 2020-02-13 MED ORDER — ACETAMINOPHEN 160 MG/5ML PO SOLN
650.0000 mg | Freq: Four times a day (QID) | ORAL | Status: DC | PRN
  Administered 2020-02-13: 650 mg via ORAL
  Filled 2020-02-13 (×3): qty 20.3

## 2020-02-13 NOTE — Progress Notes (Signed)
Dr. Fran Lowes was notified that the patient's IV was out. She stated that it is ok to leave it out.

## 2020-02-13 NOTE — Progress Notes (Addendum)
   Subjective: 2 Days Post-Op Procedure(s) (LRB): ARTHROPLASTY BIPOLAR HIP (HEMIARTHROPLASTY) (Right) Patient sleeping this morning.  Responds to touch. We will continue therapy today.    Objective: Vital signs in last 24 hours: Temp:  [98.4 F (36.9 C)-99.5 F (37.5 C)] 98.5 F (36.9 C) (11/01 2348) Pulse Rate:  [83-102] 83 (11/01 2348) Resp:  [17-20] 20 (11/01 2348) BP: (128-195)/(50-69) 152/53 (11/01 2348) SpO2:  [92 %-97 %] 93 % (11/02 0129)  Intake/Output from previous day: 11/01 0701 - 11/02 0700 In: 1365.9 [P.O.:240; I.V.:1125.9] Out: 300 [Urine:300] Intake/Output this shift: No intake/output data recorded.  Recent Labs    02/11/20 0549 02/12/20 0348 02/13/20 0342  HGB 9.5* 10.3* 7.4*   Recent Labs    02/12/20 0348 02/13/20 0342  WBC 7.9 6.5  RBC 3.83* 2.74*  HCT 34.0* 23.7*  PLT 150 173   Recent Labs    02/12/20 0348 02/13/20 0342  NA 139 137  K 3.4* 3.1*  CL 105 104  CO2 24 26  BUN 15 14  CREATININE 0.63 0.55  GLUCOSE 142* 155*  CALCIUM 8.5* 8.4*   No results for input(s): LABPT, INR in the last 72 hours.  EXAM General - Patient is Confused Extremity - No cellulitis present Compartment soft Dressing - dressing C/D/I and no drainage Praveena intact without drainage Motor Function -unable to follow commands in regards to motor function.  Past Medical History:  Diagnosis Date  . Anxiety   . Arthritis   . Back pain   . Chronic pain syndrome   . Dementia (HCC)   . Depression   . GERD (gastroesophageal reflux disease)   . Hypercholesterolemia   . Hypertension   . Knee pain   . Nausea     Assessment/Plan:   2 Days Post-Op Procedure(s) (LRB): ARTHROPLASTY BIPOLAR HIP (HEMIARTHROPLASTY) (Right) Principal Problem:   Fracture of femoral neck, right, closed (HCC) Active Problems:   Essential hypertension   Dementia (HCC)   GERD (gastroesophageal reflux disease)   Chronic pain syndrome   Hypokalemia  Estimated body mass index is  23.3 kg/m as calculated from the following:   Height as of this encounter: 5\' 5"  (1.651 m).   Weight as of this encounter: 63.5 kg. Advance diet Up with therapy  Patient is a difficult historian.  At baseline.  Patient does not appear to be in severe pain. Vital signs are stable Hemoglobin 7.4.  We will start iron supplement and recheck hemoglobin in the morning Care management to assist with discharge to skilled nursing facility   Patient will need to follow-up with [facility] orthopedics in 2 weeks Please remove provena negative pressure dressing on 02/22/2020 and apply honey comb dressing. Keep dressing clean and dry at all times. Lovenox 40 mg subcu daily x14 days at discharge TED hose bilateral lower extremity x6 weeks, okay to remove at nighttime.    DVT Prophylaxis - Lovenox, TED hose and SCDs Weight-Bearing as tolerated to right leg   T. 13/02/2020, PA-C [facility] 02/13/2020, 7:14 AM

## 2020-02-13 NOTE — Progress Notes (Signed)
PROGRESS NOTE    GORGEOUS NEWLUN  YIR:[id number] DOB: 1935-09-02 DOA: 02/11/2020 PCP: Dortha Kern, MD    Assessment & Plan:   Principal Problem:   Fracture of femoral neck, right, closed (HCC) Active Problems:   Essential hypertension   Dementia (HCC)   GERD (gastroesophageal reflux disease)   Chronic pain syndrome   Hypokalemia    Candice Baker is a 84 y.o. female with medical history significant for dementia, anxiety, hypertension, GERD who was brought into the emergency room by EMS from evaluate for evaluation of an unwitnessed fall.  Patient stated that she rolled out of bed, she was able to get back in bed but now complained of severe pain in her right hip.   Subcapital fracture of right femoral neck (Non displaced) S/p right ARTHROPLASTY on 02/11/20 --PT/OT rec SNF rehab --Pain control, though minimize narcotics due to severe somnolence --weight-bearing as tolerated to right leg --Patient will need to follow-up with [facility] orthopedics in 2 weeks --remove provena negative pressure dressing on 02/22/2020 and apply honey comb dressing. Keep dressing clean and dry at all times. --Lovenox 40 mg subcu daily x14 days at discharge --TED hose bilateral lower extremity x6 weeks, okay to remove at nighttime.  Post-op fever --temp up to 102.2 after surgery, none since --blood cx x2 neg to date --Monitor for now  Somnolence due to sedating medications --likely due to pain meds, muscle relaxant, and haldol received within 24 hours after surgery --minimize sedating medications --d/c MIVF today now that pt is more awake and having oral intake  Anemia likely blood loss --Hgb dropped to 7.4 from 10.3 PLAN: --start iron supplement --Monitor and transfuse to keep Hgb >7  Hypertension --BP varied widely, sometime very high likely due to pain --cont home amlodipine and metop for now  Depression --on Escitalopram and trazodone at home --cont Escitalopram --hold trazodone due to  somnolence  GERD  Continue PPI  Hypokalemia --replete PRN   DVT prophylaxis: Lovenox SQ Code Status: DNR  Family Communication:  Status is: inpatient Dispo:   The patient is from: assisted living Anticipated d/c is to: SNF Anticipated d/c date is: 1-2 days Patient currently is not medically stable to d/c due to: Hgb dropped significantly, need to ensure Hgb stable before discharge.   Subjective and Interval History:  Per nursing report, pt woke up more last night.  This morning, pt was sleepy again, but did wake up to eat some food offered by nursing.     No more fever.   Objective: Vitals:   02/13/20 0129 02/13/20 0825 02/13/20 1207 02/13/20 1620  BP:  (!) 174/65 (!) 122/46 (!) 129/47  Pulse:  88 76 82  Resp:  18 20 20   Temp:  98 F (36.7 C) 97.9 F (36.6 C) 98.4 F (36.9 C)  TempSrc:  Oral Oral   SpO2: 93% 97% 96% 95%  Weight:      Height:        Intake/Output Summary (Last 24 hours) at 02/13/2020 1730 Last data filed at 02/13/2020 1357 Gross per 24 hour  Intake 409.44 ml  Output 775 ml  Net -365.56 ml   Filed Weights   02/11/20 0533  Weight: 63.5 kg    Examination:   Constitutional: NAD, sleepy again CV: No cyanosis.   RESP: normal respiratory effort Extremities: No effusions, edema in BLE SKIN: warm, dry and intact   Data Reviewed: I have personally reviewed following labs and imaging studies  CBC: Recent Labs  Lab  02/11/20 0549 02/12/20 0348 02/13/20 0342  WBC 9.9 7.9 6.5  HGB 9.5* 10.3* 7.4*  HCT 30.9* 34.0* 23.7*  MCV 87.0 88.8 86.5  PLT 261 150 173   Basic Metabolic Panel: Recent Labs  Lab 02/11/20 0549 02/12/20 0348 02/13/20 0342  NA 140 139 137  K 3.4* 3.4* 3.1*  CL 104 105 104  CO2 26 24 26   GLUCOSE 154* 142* 155*  BUN 16 15 14   CREATININE 0.62 0.63 0.55  CALCIUM 9.1 8.5* 8.4*  MG  --   --  1.8   GFR: Estimated Creatinine Clearance: 48.8 mL/min (by C-G formula based on SCr of 0.55 mg/dL). Liver Function  Tests: Recent Labs  Lab 02/11/20 0549  AST 17  ALT 13  ALKPHOS 72  BILITOT 0.5  PROT 6.8  ALBUMIN 3.9   No results for input(s): LIPASE, AMYLASE in the last 168 hours. No results for input(s): AMMONIA in the last 168 hours. Coagulation Profile: No results for input(s): INR, PROTIME in the last 168 hours. Cardiac Enzymes: No results for input(s): CKTOTAL, CKMB, CKMBINDEX, TROPONINI in the last 168 hours. BNP (last 3 results) No results for input(s): PROBNP in the last 8760 hours. HbA1C: No results for input(s): HGBA1C in the last 72 hours. CBG: No results for input(s): GLUCAP in the last 168 hours. Lipid Profile: No results for input(s): CHOL, HDL, LDLCALC, TRIG, CHOLHDL, LDLDIRECT in the last 72 hours. Thyroid Function Tests: No results for input(s): TSH, T4TOTAL, FREET4, T3FREE, THYROIDAB in the last 72 hours. Anemia Panel: No results for input(s): VITAMINB12, FOLATE, FERRITIN, TIBC, IRON, RETICCTPCT in the last 72 hours. Sepsis Labs: No results for input(s): PROCALCITON, LATICACIDVEN in the last 168 hours.  Recent Results (from the past 240 hour(s))  Respiratory Panel by RT PCR (Flu A&B, Covid) - Nasopharyngeal Swab     Status: None   Collection Time: 02/11/20  6:16 AM   Specimen: Nasopharyngeal Swab  Result Value Ref Range Status   SARS Coronavirus 2 by RT PCR NEGATIVE NEGATIVE Final    Comment: (NOTE) SARS-CoV-2 target nucleic acids are NOT DETECTED.  The SARS-CoV-2 RNA is generally detectable in upper respiratoy specimens during the acute phase of infection. The lowest concentration of SARS-CoV-2 viral copies this assay can detect is 131 copies/mL. A negative result does not preclude SARS-Cov-2 infection and should not be used as the sole basis for treatment or other patient management decisions. A negative result may occur with  improper specimen collection/handling, submission of specimen other than nasopharyngeal swab, presence of viral mutation(s) within  the areas targeted by this assay, and inadequate number of viral copies (<131 copies/mL). A negative result must be combined with clinical observations, patient history, and epidemiological information. The expected result is Negative.  Fact Sheet for Patients:  02/13/20  Fact Sheet for Healthcare Providers:  02/13/20  This test is no t yet approved or cleared by the https://www.moore.com/ FDA and  has been authorized for detection and/or diagnosis of SARS-CoV-2 by FDA under an Emergency Use Authorization (EUA). This EUA will remain  in effect (meaning this test can be used) for the duration of the COVID-19 declaration under Section 564(b)(1) of the Act, 21 U.S.C. section 360bbb-3(b)(1), unless the authorization is terminated or revoked sooner.     Influenza A by PCR NEGATIVE NEGATIVE Final   Influenza B by PCR NEGATIVE NEGATIVE Final    Comment: (NOTE) The Xpert Xpress SARS-CoV-2/FLU/RSV assay is intended as an aid in  the diagnosis of influenza from Nasopharyngeal  swab specimens and  should not be used as a sole basis for treatment. Nasal washings and  aspirates are unacceptable for Xpert Xpress SARS-CoV-2/FLU/RSV  testing.  Fact Sheet for Patients: https://www.moore.com/  Fact Sheet for Healthcare Providers: https://www.young.biz/  This test is not yet approved or cleared by the [place] FDA and  has been authorized for detection and/or diagnosis of SARS-CoV-2 by  FDA under an Emergency Use Authorization (EUA). This EUA will remain  in effect (meaning this test can be used) for the duration of the  Covid-19 declaration under Section 564(b)(1) of the Act, 21  U.S.C. section 360bbb-3(b)(1), unless the authorization is  terminated or revoked. Performed at [facility], [address], [place], [place] [address]   Surgical pcr screen     Status: None   Collection  Time: 02/11/20  9:13 AM   Specimen: Nasal Mucosa; Nasal Swab  Result Value Ref Range Status   MRSA, PCR NEGATIVE NEGATIVE Final   Staphylococcus aureus NEGATIVE NEGATIVE Final    Comment: (NOTE) The Xpert SA Assay (FDA approved for NASAL specimens in patients 28 years of age and older), is one component of a comprehensive surveillance program. It is not intended to diagnose infection nor to guide or monitor treatment. Performed at [facility], [address] [place], [place] [address]   Culture, blood (Routine X 2) w Reflex to ID Panel     Status: None (Preliminary result)   Collection Time: 02/12/20  1:47 PM   Specimen: BLOOD  Result Value Ref Range Status   Specimen Description BLOOD RIGHT ANTECUBITAL  Final   Special Requests   Final    BOTTLES DRAWN AEROBIC AND ANAEROBIC Blood Culture adequate volume   Culture   Final    NO GROWTH < 24 HOURS Performed at [facility], [address], [place], [place] [address]    Report Status PENDING  Incomplete  Culture, blood (Routine X 2) w Reflex to ID Panel     Status: None (Preliminary result)   Collection Time: 02/12/20  2:02 PM   Specimen: BLOOD  Result Value Ref Range Status   Specimen Description BLOOD BLOOD RIGHT HAND  Final   Special Requests   Final    BOTTLES DRAWN AEROBIC AND ANAEROBIC Blood Culture adequate volume   Culture   Final    NO GROWTH < 24 HOURS Performed at [facility], [address], [place], [place] [address]    Report Status PENDING  Incomplete      Radiology Studies: No results found.   Scheduled Meds: . amLODipine  10 mg Oral Daily  . atorvastatin  40 mg Oral Daily  . calcium-vitamin D  1 tablet Oral Daily  . docusate sodium  100 mg Oral BID  . enoxaparin (LOVENOX) injection  40 mg Subcutaneous Q24H  . feeding supplement  237 mL Oral BID BM  . ferrous fumarate-b12-vitamic C-folic acid  1 capsule Oral BID  . FLUoxetine  20 mg Oral Daily  . gabapentin  300 mg  Oral TID  . hydrochlorothiazide  25 mg Oral Daily  . metoprolol succinate  25 mg Oral Daily  . multivitamin with minerals  1 tablet Oral Daily  . pantoprazole  40 mg Oral Daily   Continuous Infusions:    LOS: 2 days     Darlin Priestly, MD [facility] If 7PM-7AM, please contact night-coverage 02/13/2020, 5:30 PM

## 2020-02-13 NOTE — Plan of Care (Signed)
Pt more alert overnight. Repeated calling out for help or family members names. Pt given prn pain medication and also fruit and juice. Pt resting quietly at this time. Problem: Education: Goal: Verbalization of understanding the information provided (i.e., activity precautions, restrictions, etc) will improve Outcome: Progressing Goal: Individualized Educational Video(s) Outcome: Progressing   Problem: Activity: Goal: Ability to ambulate and perform ADLs will improve Outcome: Progressing   Problem: Clinical Measurements: Goal: Postoperative complications will be avoided or minimized Outcome: Progressing   Problem: Self-Concept: Goal: Ability to maintain and perform role responsibilities to the fullest extent possible will improve Outcome: Progressing   Problem: Pain Management: Goal: Pain level will decrease Outcome: Progressing   Problem: Education: Goal: Knowledge of the prescribed therapeutic regimen will improve Outcome: Progressing Goal: Understanding of discharge needs will improve Outcome: Progressing Goal: Individualized Educational Video(s) Outcome: Progressing   Problem: Activity: Goal: Ability to avoid complications of mobility impairment will improve Outcome: Progressing Goal: Ability to tolerate increased activity will improve Outcome: Progressing   Problem: Clinical Measurements: Goal: Postoperative complications will be avoided or minimized Outcome: Progressing   Problem: Pain Management: Goal: Pain level will decrease with appropriate interventions Outcome: Progressing   Problem: Skin Integrity: Goal: Will show signs of wound healing Outcome: Progressing   Problem: Education: Goal: Knowledge of General Education information will improve Description: Including pain rating scale, medication(s)/side effects and non-pharmacologic comfort measures Outcome: Progressing   Problem: Health Behavior/Discharge Planning: Goal: Ability to manage  health-related needs will improve Outcome: Progressing   Problem: Clinical Measurements: Goal: Ability to maintain clinical measurements within normal limits will improve Outcome: Progressing Goal: Will remain free from infection Outcome: Progressing Goal: Diagnostic test results will improve Outcome: Progressing Goal: Respiratory complications will improve Outcome: Progressing Goal: Cardiovascular complication will be avoided Outcome: Progressing   Problem: Activity: Goal: Risk for activity intolerance will decrease Outcome: Progressing   Problem: Nutrition: Goal: Adequate nutrition will be maintained Outcome: Progressing   Problem: Coping: Goal: Level of anxiety will decrease Outcome: Progressing   Problem: Elimination: Goal: Will not experience complications related to bowel motility Outcome: Progressing Goal: Will not experience complications related to urinary retention Outcome: Progressing   Problem: Pain Managment: Goal: General experience of comfort will improve Outcome: Progressing   Problem: Safety: Goal: Ability to remain free from injury will improve Outcome: Progressing   Problem: Skin Integrity: Goal: Risk for impaired skin integrity will decrease Outcome: Progressing

## 2020-02-13 NOTE — Progress Notes (Addendum)
Physical Therapy Treatment Patient Details Name: Candice Baker MRN: [id number] DOB: 11/03/1935 Today's Date: 02/13/2020    History of Present Illness Pt is an 84 y.o. female presenting to hospital 10/31 s/p unwitnessed fall (pt reports rolling OOB) with R hip pain.  Pt s/p R hemiarthroplasty secondary R femoral neck fx.  PMH includes dementia, anxiety, back pain, chronic pain syndrome, dementia, depression, htn, knee pain, THA L, distal femur fx with plating, wrist fx 2020.    PT Comments    Patient progressing slowly towards meeting functional goals. Patient required Max A for bed mobility with assistance for trunk and BLE support. Increased time required for motor planning. Patient dangled at edge of bed for four minutes with Min A for midline trunk support initially, progressing to SBA with increased sitting  time. Sitting time limited by fatigue with activity. Sp02 92-97% with activity. Recommend to continue PT to address remaining functional limitations. SNF remains appropriate discharge plan.    Follow Up Recommendations  SNF     Equipment Recommendations   (to be determined at next level of care )    Recommendations for Other Services       Precautions / Restrictions Precautions Precautions: Fall Precaution Comments: Direct anterior; wound vac Restrictions Weight Bearing Restrictions: Yes RLE Weight Bearing: Weight bearing as tolerated    Mobility  Bed Mobility Overal bed mobility: Needs Assistance Bed Mobility: Supine to Sit;Sit to Supine     Supine to sit: Max assist Sit to supine: Max assist   General bed mobility comments: assistance for LE and trunk support. extra time required to complete tasks.   Transfers                 General transfer comment: not assessed due to poor sitting tolerance and fatigue with activity   Ambulation/Gait                 Stairs             Wheelchair Mobility    Modified Rankin (Stroke Patients Only)        Balance                                            Cognition Arousal/Alertness: Lethargic Behavior During Therapy: Flat affect Overall Cognitive Status: No family/caregiver present to determine baseline cognitive functioning                                 General Comments: patient able to follow single step commands intermittently with repetition and increased time       Exercises Total Joint Exercises Ankle Circles/Pumps: AAROM;Strengthening;Both;10 reps;Supine Heel Slides: AAROM;Strengthening;Both;10 reps;Supine Hip ABduction/ADduction: AAROM;Strengthening;Both;10 reps;Supine Other Exercises Other Exercises: verbal and tactile cues for exercise technique. grimacing intermittently initially with right hip movement     General Comments        Pertinent Vitals/Pain Faces Pain Scale: Hurts even more Pain Location: right hip  Pain Descriptors / Indicators: Grimacing;Discomfort;Guarding Pain Intervention(s): Limited activity within patient's tolerance;Ice applied (ice pack reapplied to right hip at end of session )    Home Living                      Prior Function            PT Goals (  current goals can now be found in the care plan section) Acute Rehab PT Goals Patient Stated Goal: to have less pain PT Goal Formulation: With patient Potential to Achieve Goals: Fair Progress towards PT goals: Progressing toward goals    Frequency    7X/week      PT Plan Current plan remains appropriate    Co-evaluation              AM-PAC PT "6 Clicks" Mobility   Outcome Measure  Help needed turning from your back to your side while in a flat bed without using bedrails?: A Lot Help needed moving from lying on your back to sitting on the side of a flat bed without using bedrails?: A Lot Help needed moving to and from a bed to a chair (including a wheelchair)?: Total Help needed standing up from a chair using your arms  (e.g., wheelchair or bedside chair)?: Total Help needed to walk in hospital room?: Total Help needed climbing 3-5 steps with a railing? : Total 6 Click Score: 8    End of Session Equipment Utilized During Treatment: Oxygen Activity Tolerance: Patient limited by pain;Patient limited by lethargy Patient left: in bed;with call bell/phone within reach;with bed alarm set (heels floating with towel roll support, ice pack right hip ) Nurse Communication:  (white board up to date with mobility status ) PT Visit Diagnosis: Other abnormalities of gait and mobility (R26.89);Muscle weakness (generalized) (M62.81);History of falling (Z91.81);Difficulty in walking, not elsewhere classified (R26.2);Pain Pain - Right/Left: Right Pain - part of body: Hip     Time: 4098-1191 PT Time Calculation (min) (ACUTE ONLY): 21 min  Charges:  $Therapeutic Exercise: 8-22 mins                     Donna Bernard, PT, MPT    Ina Homes 02/13/2020, 1:29 PM

## 2020-02-13 NOTE — TOC Progression Note (Signed)
Transition of Care [facility]) - Progression Note    Patient Details  Name: Candice Baker MRN: [id number] Date of Birth: Apr 23, 1935  Transition of Care [facility]) CM/SW Contact  Liliana Cline, LCSW Phone Number: 02/13/2020, 10:47 AM  Clinical Narrative:   Call to [facility], started insurance authorization for SNF rehab. Reference #: [id number]. Clinicals faxed.  Call to granddaughter Judeth Cornfield with update of bed offer at preferred facility, [facility] in [place]. She reported they are working on long term Medicaid application process and wanted to know if patient can stay as long as possible in the hospital since her Medicare only covers SNF rehab short term. Explained patient would need to discharge when MD feels she's medically ready, she verbalized understanding. She asked for a copy of patient's FL2 for the Medicaid application, CSW sent via secure email per her request.        Expected Discharge Plan: Skilled Nursing Facility Barriers to Discharge: No Barriers Identified  Expected Discharge Plan and Services Expected Discharge Plan: Skilled Nursing Facility     Post Acute Care Choice: Skilled Nursing Facility Living arrangements for the past 2 months: Single Family Home                                       Social Determinants of Health (SDOH) Interventions    Readmission Risk Interventions No flowsheet data found.

## 2020-02-14 DIAGNOSIS — I10 Essential (primary) hypertension: Secondary | ICD-10-CM

## 2020-02-14 DIAGNOSIS — D62 Acute posthemorrhagic anemia: Secondary | ICD-10-CM

## 2020-02-14 LAB — BASIC METABOLIC PANEL
Anion gap: 9 (ref 5–15)
BUN: 13 mg/dL (ref 8–23)
CO2: 25 mmol/L (ref 22–32)
Calcium: 8.4 mg/dL — ABNORMAL LOW (ref 8.9–10.3)
Chloride: 102 mmol/L (ref 98–111)
Creatinine, Ser: 0.49 mg/dL (ref 0.44–1.00)
GFR, Estimated: >60 mL/min (ref >60)
Glucose, Bld: 151 mg/dL — ABNORMAL HIGH (ref 70–99)
Potassium: 3.5 mmol/L (ref 3.5–5.1)
Sodium: 136 mmol/L (ref 135–145)

## 2020-02-14 LAB — CBC
HCT: 24.9 % — ABNORMAL LOW (ref 36.0–46.0)
Hemoglobin: 7.8 g/dL — ABNORMAL LOW (ref 12.0–15.0)
MCH: 26.9 pg (ref 26.0–34.0)
MCHC: 31.3 g/dL (ref 30.0–36.0)
MCV: 85.9 fL (ref 80.0–100.0)
Platelets: 212 10*3/uL (ref 150–400)
RBC: 2.9 MIL/uL — ABNORMAL LOW (ref 3.87–5.11)
RDW: 15.7 % — ABNORMAL HIGH (ref 11.5–15.5)
WBC: 6.5 10*3/uL (ref 4.0–10.5)
nRBC: 0 % (ref 0.0–0.2)

## 2020-02-14 LAB — SURGICAL PATHOLOGY

## 2020-02-14 LAB — SARS CORONAVIRUS 2 BY RT PCR (HOSPITAL ORDER, PERFORMED IN ~~LOC~~ HOSPITAL LAB): SARS Coronavirus 2: NEGATIVE

## 2020-02-14 LAB — MAGNESIUM: Magnesium: 1.9 mg/dL (ref 1.7–2.4)

## 2020-02-14 MED ORDER — ADULT MULTIVITAMIN W/MINERALS CH: 1.0000 | ORAL_TABLET | Freq: Every day | ORAL | 0 refills | Status: AC

## 2020-02-14 MED ORDER — ENSURE ENLIVE PO LIQD: 237.0000 mL | Freq: Two times a day (BID) | ORAL | 12 refills | Status: AC

## 2020-02-14 MED ORDER — AMLODIPINE BESYLATE 10 MG PO TABS: 10.0000 mg | ORAL_TABLET | Freq: Every day | ORAL | 0 refills | Status: AC

## 2020-02-14 MED ORDER — HALOPERIDOL LACTATE 5 MG/ML IJ SOLN: 2.0000 mg | Freq: Four times a day (QID) | INTRAMUSCULAR | Status: DC | PRN

## 2020-02-14 MED ORDER — FE FUMARATE-B12-VIT C-FA-IFC PO CAPS: 1.0000 | ORAL_CAPSULE | Freq: Two times a day (BID) | ORAL | 1 refills | Status: AC

## 2020-02-14 MED ORDER — DOCUSATE SODIUM 100 MG PO CAPS: 100.0000 mg | ORAL_CAPSULE | Freq: Two times a day (BID) | ORAL | 0 refills | Status: AC

## 2020-02-14 MED ORDER — METOPROLOL SUCCINATE ER 25 MG PO TB24: 25.0000 mg | ORAL_TABLET | Freq: Every day | ORAL | 0 refills | Status: AC

## 2020-02-14 MED ORDER — CALCIUM CARBONATE-VITAMIN D 500-200 MG-UNIT PO TABS: 1.0000 | ORAL_TABLET | Freq: Every day | ORAL | 0 refills | Status: AC

## 2020-02-14 NOTE — Progress Notes (Addendum)
End of shift:  No acute changes overnight. Had some trouble getting patient to take p.o. medication at times due to being somnolent. Patient is restless and calling out intermittently. Was able to drink some water. Report given to [facility], RN.

## 2020-02-14 NOTE — Care Management Important Message (Signed)
Important Message  Patient Details  Name: Candice Baker MRN: [id number] Date of Birth: 10-17-1935   Medicare Important Message Given:  N/A - LOS <3 / Initial given by admissions     Olegario Messier A Ellyssa Zagal 02/14/2020, 7:48 AM

## 2020-02-14 NOTE — Plan of Care (Signed)
  Problem: Clinical Measurements: Goal: Postoperative complications will be avoided or minimized Outcome: Progressing   Problem: Pain Management: Goal: Pain level will decrease Outcome: Progressing   Problem: Skin Integrity: Goal: Will show signs of wound healing Outcome: Progressing

## 2020-02-14 NOTE — Progress Notes (Signed)
Pt discharged to [facility] via EMS. Pt discharge instructions given to EMS.  No s/s of distress, VSS.  Prevena in place.

## 2020-02-14 NOTE — Progress Notes (Signed)
   Subjective: 3 Days Post-Op Procedure(s) (LRB): ARTHROPLASTY BIPOLAR HIP (HEMIARTHROPLASTY) (Right) Patient much more alert this morning.  Sedating medications DC'd yesterday.  States pain is controlled.  Difficult historian. We will continue therapy today.    Objective: Vital signs in last 24 hours: Temp:  [97.9 F (36.6 C)-99.2 F (37.3 C)] 98.5 F (36.9 C) (11/03 0721) Pulse Rate:  [76-95] 91 (11/03 0721) Resp:  [17-24] 24 (11/03 0721) BP: (122-174)/(46-77) 163/77 (11/03 0721) SpO2:  [91 %-98 %] 98 % (11/03 0721)  Intake/Output from previous day: 11/02 0701 - 11/03 0700 In: 409.4 [P.O.:360; IV Piggyback:49.4] Out: 1275 [Urine:1275] Intake/Output this shift: No intake/output data recorded.  Recent Labs    02/12/20 0348 02/13/20 0342 02/14/20 0410  HGB 10.3* 7.4* 7.8*   Recent Labs    02/13/20 0342 02/14/20 0410  WBC 6.5 6.5  RBC 2.74* 2.90*  HCT 23.7* 24.9*  PLT 173 212   Recent Labs    02/13/20 0342 02/14/20 0410  NA 137 136  K 3.1* 3.5  CL 104 102  CO2 26 25  BUN 14 13  CREATININE 0.55 0.49  GLUCOSE 155* 151*  CALCIUM 8.4* 8.4*   No results for input(s): LABPT, INR in the last 72 hours.  EXAM General - Patient is Alert and Appropriate Extremity - No cellulitis present Compartment soft Dressing - dressing C/D/I and no drainage Praveena intact without drainage Motor Function -unable to follow commands in regards to motor function.  Past Medical History:  Diagnosis Date  . Anxiety   . Arthritis   . Back pain   . Chronic pain syndrome   . Dementia (HCC)   . Depression   . GERD (gastroesophageal reflux disease)   . Hypercholesterolemia   . Hypertension   . Knee pain   . Nausea     Assessment/Plan:   3 Days Post-Op Procedure(s) (LRB): ARTHROPLASTY BIPOLAR HIP (HEMIARTHROPLASTY) (Right) Principal Problem:   Fracture of femoral neck, right, closed (HCC) Active Problems:   Essential hypertension   Dementia (HCC)   GERD  (gastroesophageal reflux disease)   Chronic pain syndrome   Hypokalemia  Estimated body mass index is 23.3 kg/m as calculated from the following:   Height as of this encounter: 5\' 5"  (1.651 m).   Weight as of this encounter: 63.5 kg. Advance diet Up with therapy  Patient is a difficult historian.  More alert today.  Pain well controlled. Vital signs are stable Hemoglobin 7.8, trending up.  Continue with iron supplement  Care management to assist with discharge to skilled nursing facility   Patient will need to follow-up with [facility] orthopedics in 2 weeks Please remove provena negative pressure dressing on 02/22/2020 and apply honey comb dressing. Keep dressing clean and dry at all times. Lovenox 40 mg subcu daily x14 days at discharge TED hose bilateral lower extremity x6 weeks, okay to remove at nighttime.    DVT Prophylaxis - Lovenox, TED hose and SCDs Weight-Bearing as tolerated to right leg   T. 13/02/2020, PA-C [facility] 02/14/2020, 8:08 AM

## 2020-02-14 NOTE — Discharge Summary (Signed)
[facility] [place] [facility]   PATIENT NAME: Candice Baker    MR#:  [id number]  DATE OF BIRTH:  1935/07/15  DATE OF ADMISSION:  02/11/2020 ADMITTING PHYSICIAN: Lucile Shutters, MD  DATE OF DISCHARGE: 02/14/2020  PRIMARY CARE PHYSICIAN: Dortha Kern, MD    ADMISSION DIAGNOSIS:  Fracture of femoral neck, right, closed (HCC) [S72.001A] Closed fracture of neck of right femur, initial encounter (HCC) [S72.001A] Fall, initial encounter [W19.XXXA] Dementia without behavioral disturbance, unspecified dementia type (HCC) [F03.90]  DISCHARGE DIAGNOSIS:  Nondisplaced subcapital fracture of the right femoral neck status post surgery postop anemia SECONDARY DIAGNOSIS:   Past Medical History:  Diagnosis Date  . Anxiety   . Arthritis   . Back pain   . Chronic pain syndrome   . Dementia (HCC)   . Depression   . GERD (gastroesophageal reflux disease)   . Hypercholesterolemia   . Hypertension   . Knee pain   . Nausea     HOSPITAL COURSE:   Candice Baker a 84 y.o.femalewith medical history significant fordementia, anxiety, hypertension, GERD who was brought into the emergency room by EMS from evaluate for evaluation of an unwitnessed fall. Patient stated that she rolled out of bed, she was able to get back in bed but now complained of severe pain in her right hip.   Subcapital fracture of right femoral neck(Non displaced) S/p right ARTHROPLASTY on 02/11/20 by Dr Rosita Kea --PT/OT rec SNF rehab --Pain control, though minimize narcotics due to severe somnolence --weight-bearing as tolerated to right leg --Patient will need to follow-up with [facility]oped[facility] in 2 weeks --Keep dressing clean and dry at all times. --Lovenox 40 mg subcu daily x14 days at discharge per ortho --TED hose bilateral lower extremity x6 weeks, okay to remove at nighttime.  Post-op fever --temp up to 102.2 after surgery, none since --blood cx x2 neg to date --remains  afebrile  Somnolence due to sedating medications --likely due to pain meds, muscle relaxant, and haldol received within 24 hours after surgery --minimize sedating medications --more awake and conversing this am  Anemia likely blood loss --Hgb dropped from 10.3--7.4--7.8--stable --start iron supplement --Monitor and transfuse to keep Hgb >7  Hypertension --BP varied widely, sometime very high likely due to pain --cont home amlodipine, metoprolol and HCTZ \  Depression/Dementia w/o behavioral change --cont Escitalopram --hold trazodone due to somnolence  GERD Continue PPI  Hypokalemia --repleted, K 3.5  Overall improving slowly. OK from ortho standpoint for d/c--d/w Dr Rosita Kea   DVT prophylaxis: Lovenox SQ Code Status: DNR  Family Communication:  Status is: inpatient Dispo:    patient is from: assisted living Anticipated d/c is to: SNF Anticipated d/c date is: today once insurance auth confirmed   CONSULTS OBTAINED:  Treatment Team:  Kennedy Bucker, MD  DRUG ALLERGIES:  No Known Allergies  DISCHARGE MEDICATIONS:   Allergies as of 02/14/2020   No Known Allergies     Medication List    STOP taking these medications   Calcium-Vitamin D 600-200 MG-UNIT tablet Replaced by: calcium-vitamin D 500-200 MG-UNIT tablet   promethazine 25 MG tablet Commonly known as: PHENERGAN   traZODone 50 MG tablet Commonly known as: DESYREL     TAKE these medications   acetaminophen 325 MG tablet Commonly known as: TYLENOL Take 650 mg by mouth every 6 (six) hours as needed for pain.   amLODipine 10 MG tablet Commonly known as: NORVASC Take 1 tablet (10 mg total) by mouth daily. What changed:  medication  strength how much to take when to take this   atorvastatin 40 MG tablet Commonly known as: LIPITOR Take 40 mg by mouth daily.   calcium-vitamin D 500-200 MG-UNIT tablet Commonly known as: OSCAL WITH D Take 1 tablet by mouth daily. Replaces:  Calcium-Vitamin D 600-200 MG-UNIT tablet   diazepam 5 MG tablet Commonly known as: VALIUM Take 5 mg by mouth 2 (two) times daily as needed for anxiety.   docusate sodium 100 MG capsule Commonly known as: COLACE Take 1 capsule (100 mg total) by mouth 2 (two) times daily.   enoxaparin 40 MG/0.4ML injection Commonly known as: LOVENOX Inject 0.4 mLs (40 mg total) into the skin daily for 14 days.   feeding supplement Liqd Take 237 mLs by mouth 2 (two) times daily between meals.   ferrous fumarate-b12-vitamic C-folic acid capsule Commonly known as: TRINSICON / FOLTRIN Take 1 capsule by mouth 2 (two) times daily.   FLUoxetine 20 MG capsule Commonly known as: PROZAC Take 20 mg by mouth daily.   gabapentin 300 MG capsule Commonly known as: NEURONTIN Take 300-900 mg by mouth 3 (three) times daily. Take one capsule (300 mg) by mouth every morning, then one capsule (300 mg) at 3 pm, and three capsules (900 mg) at bedtime.   hydrochlorothiazide 12.5 MG capsule Commonly known as: MICROZIDE Take 12.5 mg by mouth daily.   HYDROcodone-acetaminophen 5-325 MG tablet Commonly known as: NORCO/VICODIN Take 1 tablet by mouth every 6 (six) hours as needed for moderate pain or severe pain (pain score 4-6).   hydrOXYzine 10 MG tablet Commonly known as: ATARAX/VISTARIL Take 10 mg by mouth 3 (three) times daily as needed.   metoprolol succinate 25 MG 24 hr tablet Commonly known as: TOPROL-XL Take 1 tablet (25 mg total) by mouth daily. What changed: how much to take   multivitamin with minerals Tabs tablet Take 1 tablet by mouth daily.   omeprazole 40 MG capsule Commonly known as: PRILOSEC Take 40 mg by mouth daily.   prochlorperazine 5 MG tablet Commonly known as: COMPAZINE Take 5 mg by mouth every 4 (four) hours as needed.            Discharge Care Instructions  (From admission, onward)         Start     Ordered   02/14/20 0000  Discharge wound care:       Comments:  Reinforce dressing as needed per Ortho   02/14/20 0814          If you experience worsening of your admission symptoms, develop shortness of breath, life threatening emergency, suicidal or homicidal thoughts you must seek medical attention immediately by calling 911 or calling your MD immediately  if symptoms less severe.  You Must read complete instructions/literature along with all the possible adverse reactions/side effects for all the Medicines you take and that have been prescribed to you. Take any new Medicines after you have completely understood and accept all the possible adverse reactions/side effects.   Please note  You were cared for by a hospitalist during your hospital stay. If you have any questions about your discharge medications or the care you received while you were in the hospital after you are discharged, you can call the unit and asked to speak with the hospitalist on call if the hospitalist that took care of you is not available. Once you are discharged, your primary care physician will handle any further medical issues. Please note that NO REFILLS for any discharge medications will  be authorized once you are discharged, as it is imperative that you return to your primary care physician (or establish a relationship with a primary care physician if you do not have one) for your aftercare needs so that they can reassess your need for medications and monitor your lab values. Today   SUBJECTIVE   More awake today Per RN no issues Seen by Dr Rosita Kea this morning  VITAL SIGNS:  Blood pressure (!) 163/77, pulse 91, temperature 98.5 F (36.9 C), temperature source Oral, resp. rate (!) 24, height 5\' 5"  (1.651 m), weight 63.5 kg, SpO2 98 %.  I/O:    Intake/Output Summary (Last 24 hours) at 02/14/2020 0814 Last data filed at 02/14/2020 0330 Gross per 24 hour  Intake 409.44 ml  Output 1275 ml  Net -865.56 ml    PHYSICAL EXAMINATION:  GENERAL:  84 y.o.-year-old patient  lying in the bed with no acute distress.   HEENT: Head atraumatic, normocephalic. Oropharynx and nasopharynx clear.  NECK:  Supple, no jugular venous distention. No thyroid enlargement, no tenderness.  LUNGS: Normal breath sounds bilaterally, no wheezing, rales,rhonchi or crepitation. No use of accessory muscles of respiration.  CARDIOVASCULAR: S1, S2 normal. No murmurs, rubs, or gallops.  ABDOMEN: Soft, non-tender, non-distended. Bowel sounds present. No organomegaly or mass.  EXTREMITIES: No pedal edema, cyanosis, or clubbing. surgical site looks ok right hip NEUROLOGIC:grossly non-focal PSYCHIATRIC: The patient is alert and awake. Some baseline confusion +  SKIN: No obvious rash, lesion, or ulcer.   DATA REVIEW:   CBC  Recent Labs  Lab 02/14/20 0410  WBC 6.5  HGB 7.8*  HCT 24.9*  PLT 212    Chemistries  Recent Labs  Lab 02/11/20 0549 02/12/20 0348 02/14/20 0410  NA 140   < > 136  K 3.4*   < > 3.5  CL 104   < > 102  CO2 26   < > 25  GLUCOSE 154*   < > 151*  BUN 16   < > 13  CREATININE 0.62   < > 0.49  CALCIUM 9.1   < > 8.4*  MG  --    < > 1.9  AST 17  --   --   ALT 13  --   --   ALKPHOS 72  --   --   BILITOT 0.5  --   --    < > = values in this interval not displayed.    Microbiology Results   Recent Results (from the past 240 hour(s))  Respiratory Panel by RT PCR (Flu A&B, Covid) - Nasopharyngeal Swab     Status: None   Collection Time: 02/11/20  6:16 AM   Specimen: Nasopharyngeal Swab  Result Value Ref Range Status   SARS Coronavirus 2 by RT PCR NEGATIVE NEGATIVE Final    Comment: (NOTE) SARS-CoV-2 target nucleic acids are NOT DETECTED.  The SARS-CoV-2 RNA is generally detectable in upper respiratoy specimens during the acute phase of infection. The lowest concentration of SARS-CoV-2 viral copies this assay can detect is 131 copies/mL. A negative result does not preclude SARS-Cov-2 infection and should not be used as the sole basis for treatment  or other patient management decisions. A negative result may occur with  improper specimen collection/handling, submission of specimen other than nasopharyngeal swab, presence of viral mutation(s) within the areas targeted by this assay, and inadequate number of viral copies (<131 copies/mL). A negative result must be combined with clinical observations, patient history, and epidemiological information. The expected  result is Negative.  Fact Sheet for Patients:  https://www.moore.com/  Fact Sheet for Healthcare Providers:  https://www.young.biz/  This test is no t yet approved or cleared by the [place] FDA and  has been authorized for detection and/or diagnosis of SARS-CoV-2 by FDA under an Emergency Use Authorization (EUA). This EUA will remain  in effect (meaning this test can be used) for the duration of the COVID-19 declaration under Section 564(b)(1) of the Act, 21 U.S.C. section 360bbb-3(b)(1), unless the authorization is terminated or revoked sooner.     Influenza A by PCR NEGATIVE NEGATIVE Final   Influenza B by PCR NEGATIVE NEGATIVE Final    Comment: (NOTE) The Xpert Xpress SARS-CoV-2/FLU/RSV assay is intended as an aid in  the diagnosis of influenza from Nasopharyngeal swab specimens and  should not be used as a sole basis for treatment. Nasal washings and  aspirates are unacceptable for Xpert Xpress SARS-CoV-2/FLU/RSV  testing.  Fact Sheet for Patients: https://www.moore.com/  Fact Sheet for Healthcare Providers: https://www.young.biz/  This test is not yet approved or cleared by the [place] FDA and  has been authorized for detection and/or diagnosis of SARS-CoV-2 by  FDA under an Emergency Use Authorization (EUA). This EUA will remain  in effect (meaning this test can be used) for the duration of the  Covid-19 declaration under Section 564(b)(1) of the Act, 21  U.S.C.  section 360bbb-3(b)(1), unless the authorization is  terminated or revoked. Performed at [facility], [address], [place], [place] [address]   Surgical pcr screen     Status: None   Collection Time: 02/11/20  9:13 AM   Specimen: Nasal Mucosa; Nasal Swab  Result Value Ref Range Status   MRSA, PCR NEGATIVE NEGATIVE Final   Staphylococcus aureus NEGATIVE NEGATIVE Final    Comment: (NOTE) The Xpert SA Assay (FDA approved for NASAL specimens in patients 30 years of age and older), is one component of a comprehensive surveillance program. It is not intended to diagnose infection nor to guide or monitor treatment. Performed at [facility], [address] [place], [place] [address]   Culture, blood (Routine X 2) w Reflex to ID Panel     Status: None (Preliminary result)   Collection Time: 02/12/20  1:47 PM   Specimen: BLOOD  Result Value Ref Range Status   Specimen Description BLOOD RIGHT ANTECUBITAL  Final   Special Requests   Final    BOTTLES DRAWN AEROBIC AND ANAEROBIC Blood Culture adequate volume   Culture   Final    NO GROWTH 2 DAYS Performed at [facility], [address] [place], [place] [address]    Report Status PENDING  Incomplete  Culture, blood (Routine X 2) w Reflex to ID Panel     Status: None (Preliminary result)   Collection Time: 02/12/20  2:02 PM   Specimen: BLOOD  Result Value Ref Range Status   Specimen Description BLOOD BLOOD RIGHT HAND  Final   Special Requests   Final    BOTTLES DRAWN AEROBIC AND ANAEROBIC Blood Culture adequate volume   Culture   Final    NO GROWTH 2 DAYS Performed at [facility], [address], [place], [place] [address]    Report Status PENDING  Incomplete    RADIOLOGY:  No results found.   CODE STATUS:     Code Status Orders  (From admission, onward)         Start     Ordered   02/11/20 218 300 0924  Do not attempt resuscitation (DNR)  Continuous       Question Answer Comment   In the event of cardiac or respiratory ARREST Do not call a "code blue"   In the event of cardiac or respiratory ARREST Do not perform Intubation, CPR, defibrillation or ACLS   In the event of cardiac or respiratory ARREST Use medication by any route, position, wound care, and other measures to relive pain and suffering. May use oxygen, suction and manual treatment of airway obstruction as needed for comfort.   Comments Code status was discussed with patient's son Mr Caesar BookmanRandall Friedland and he wishes for her to be placed on a DNR status      02/11/20 0754        Code Status History    Date Active Date Inactive Code Status Order ID Comments User Context   02/11/2020 0744 02/11/2020 0754 Full Code [id number]  Lucile ShuttersAgbata, Tochukwu, MD ED   Advance Care Planning Activity       TOTAL TIME TAKING CARE OF THIS PATIENT: *35* minutes.    Enedina FinnerSona Jacqualynn Parco M.D  [facility]    CC: Primary care physician; Dortha KernBliss, Laura K, MD

## 2020-02-14 NOTE — TOC Transition Note (Addendum)
Transition of Care [facility]) - CM/SW Discharge Note   Patient Details  Name: Candice Baker MRN: [id number] Date of Birth: 06/08/1935  [facility] CM/SW Contact:  Liliana Cline, LCSW Phone Number: 02/14/2020, 12:58 PM   Clinical Narrative:   Patient to discharge to [facility] in [place], [place] today. Room 608. Updated RN, MD, and patient's granddaughter Candice Baker. DNR, Medical Necessity Form, and Facesheet placed in Discharge Packet by chart. [facility] arranged for a 3:00 pick up. RN informed SNF staff of wound vac needs per MD. No additional needs.   1:00- Informed granddaughter Candice Baker of pick up arranged for 3:00. She inquired if patient will still discharge on oxygen. Informed her that MD did not say this would delay discharge but CSW will send message to MD to make sure and will alter discharge plans if needed. Per MD this is not a barrier to discharge at this time.    Final next level of care: Skilled Nursing Facility Barriers to Discharge: Barriers Resolved   Patient Goals and CMS Choice Patient states their goals for this hospitalization and ongoing recovery are:: SNF rehab per granddaughter CMS Medicare.gov Compare Post Acute Care list provided to:: Patient Represenative (must comment) Choice offered to / list presented to :  (granddaughter, daughter)  Discharge Placement              Patient chooses bed at:  ([facility] in [place], [place]) Patient to be transferred to facility by: EMS Name of family member notified: Candice Baker (granddaughter) Patient and family notified of of transfer: 02/14/20  Discharge Plan and Services     Post Acute Care Choice: Skilled Nursing Facility                               Social Determinants of Health (SDOH) Interventions     Readmission Risk Interventions No flowsheet data found.

## 2020-02-17 LAB — CULTURE, BLOOD (ROUTINE X 2)
Culture: NO GROWTH
Culture: NO GROWTH
Special Requests: ADEQUATE
Special Requests: ADEQUATE

## 2020-04-13 DEATH — deceased

## 2021-10-13 IMAGING — XA DG HIP (WITH PELVIS) OPERATIVE*R*
2 series · 10 of 10 positions shown · non-contrast
Comparison: 02/11/2020

CLINICAL DATA: RIGHT total hip arthroplasty. Fluoro time 6 seconds.

EXAM:
DG HIP (WITH OR WITHOUT PELVIS) 2-3V RIGHT; OPERATIVE RIGHT HIP WITH
PELVIS

[Series 2: ortho standard · 2 acquisitions, 5 frames shown (1 of 2)]
[im 1/2]
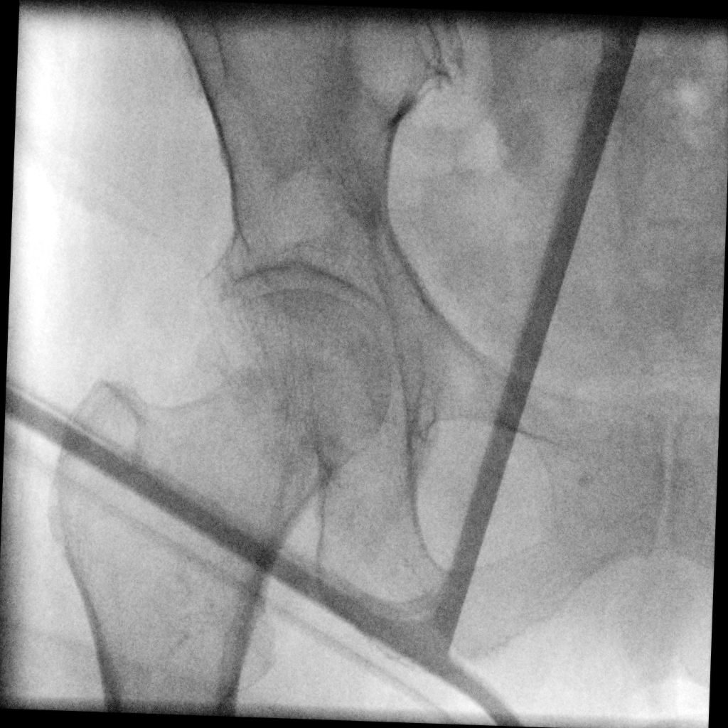
[im 1/2]
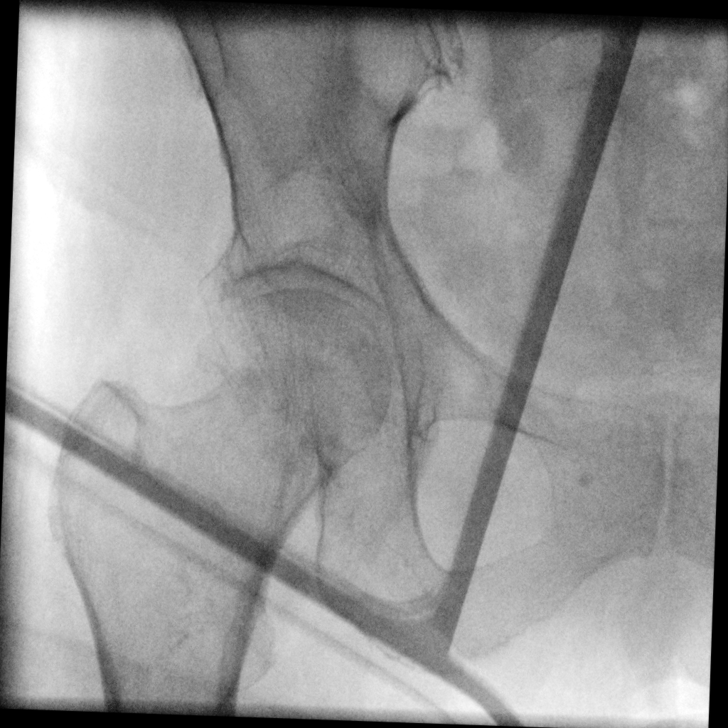
[im 1/2]
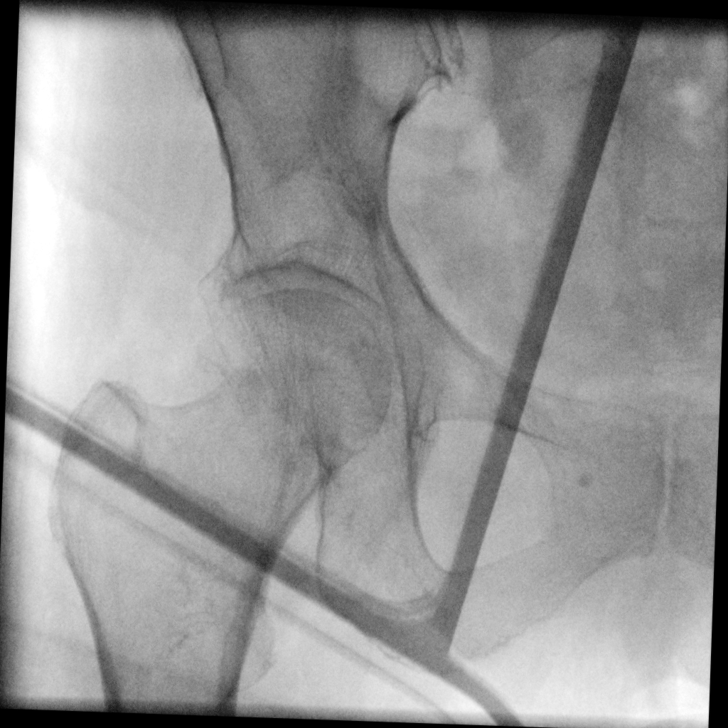
[im 1/2]
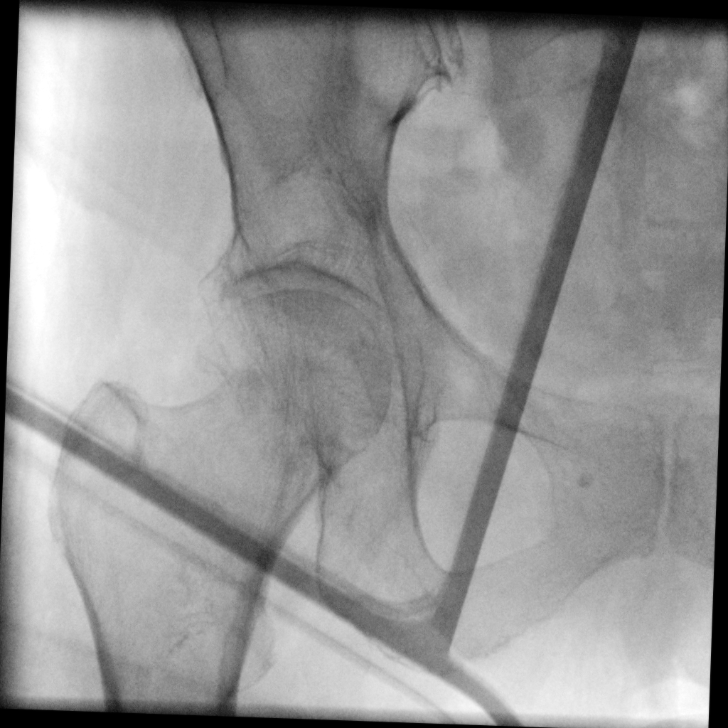
[im 2/2]
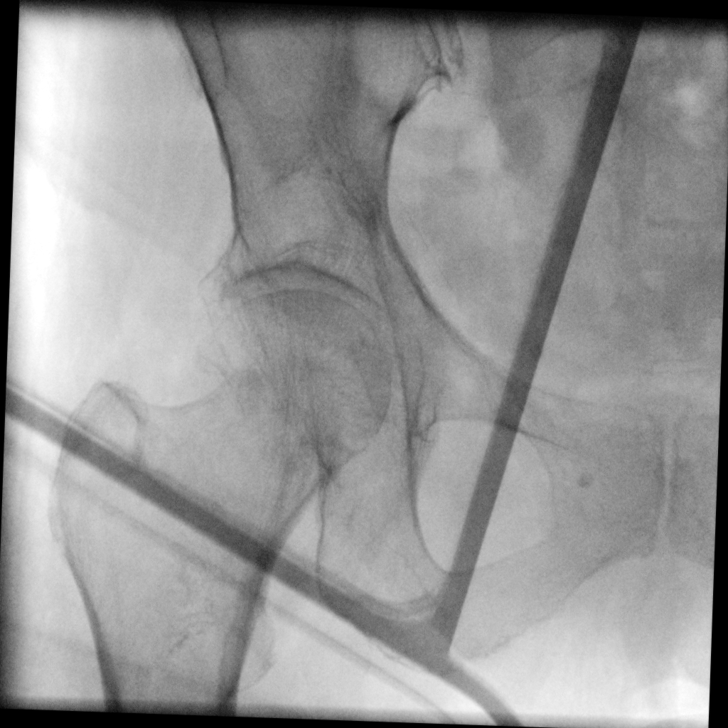

[Series 3: ortho standard · 2 acquisitions, 5 frames shown (2 of 2)]
[im 1/2]
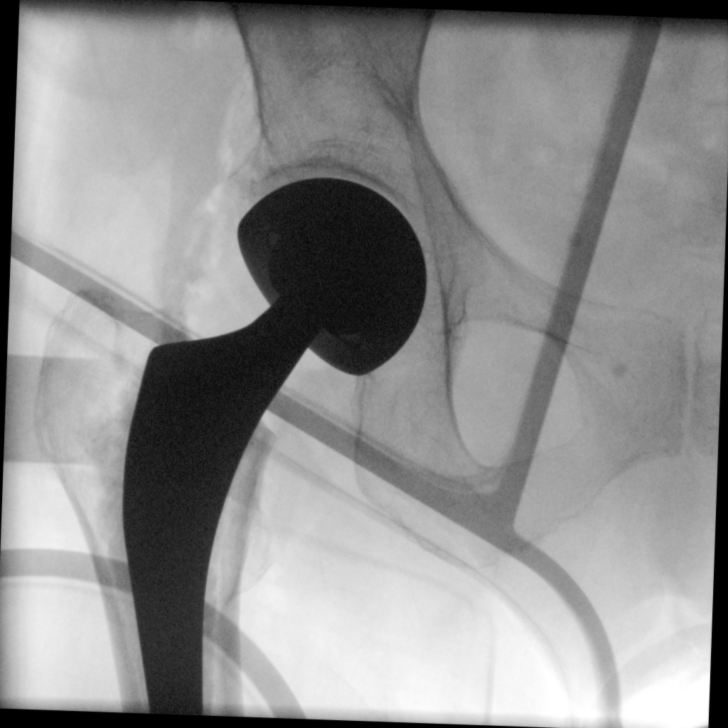
[im 2/2]
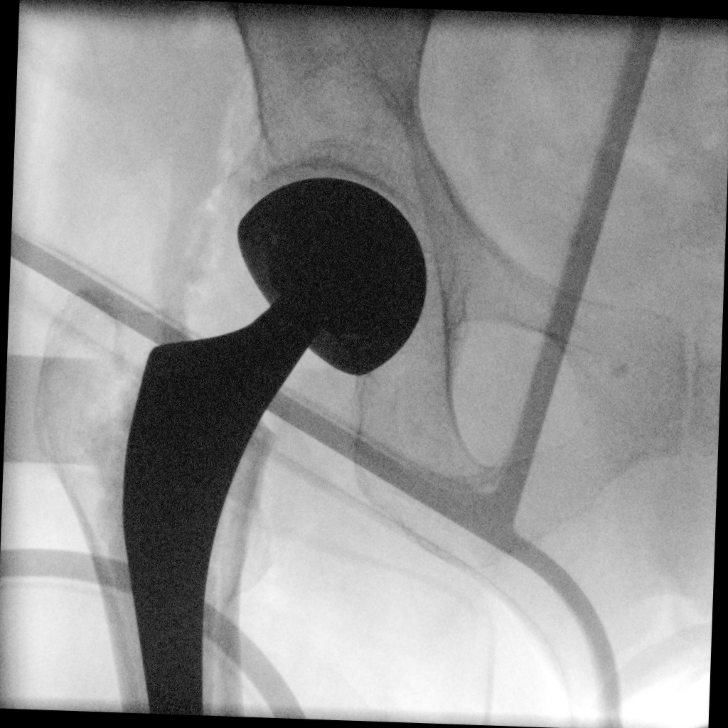
[im 2/2]
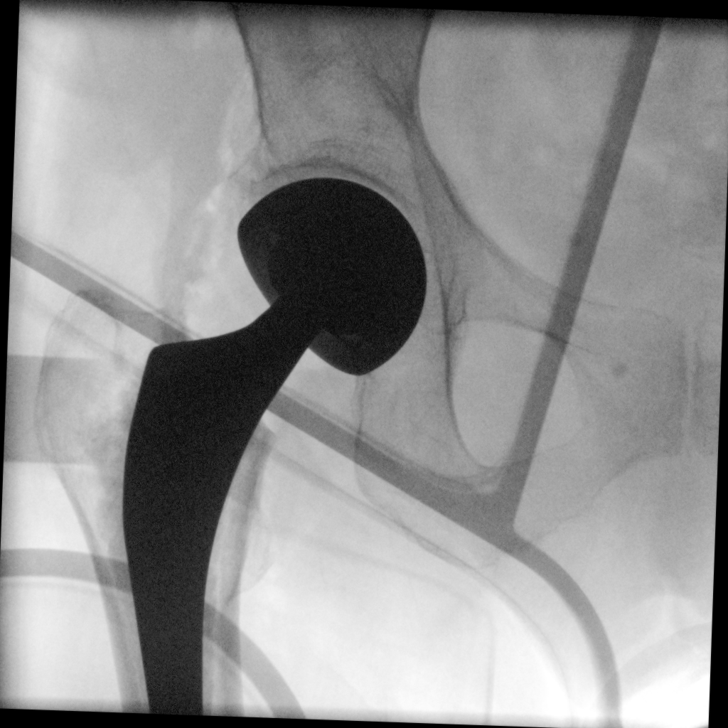
[im 2/2]
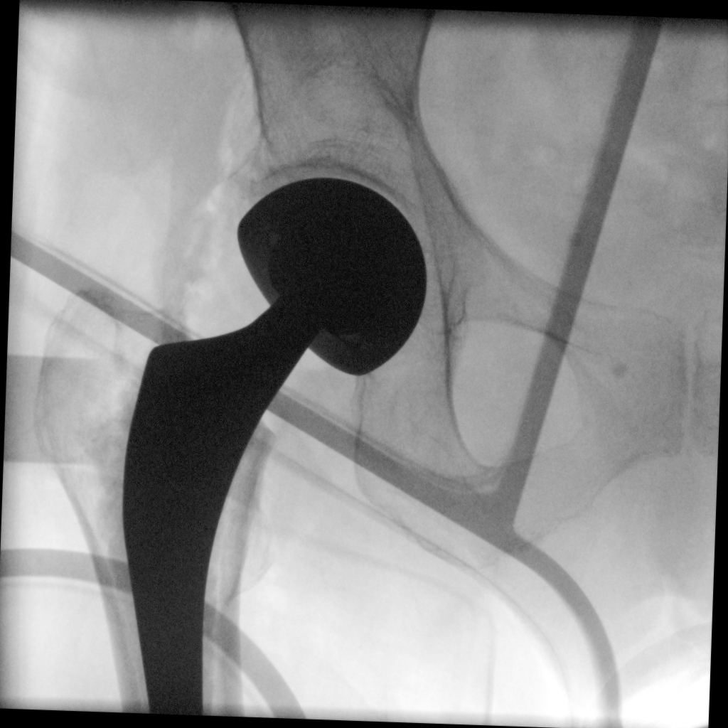
[im 2/2]
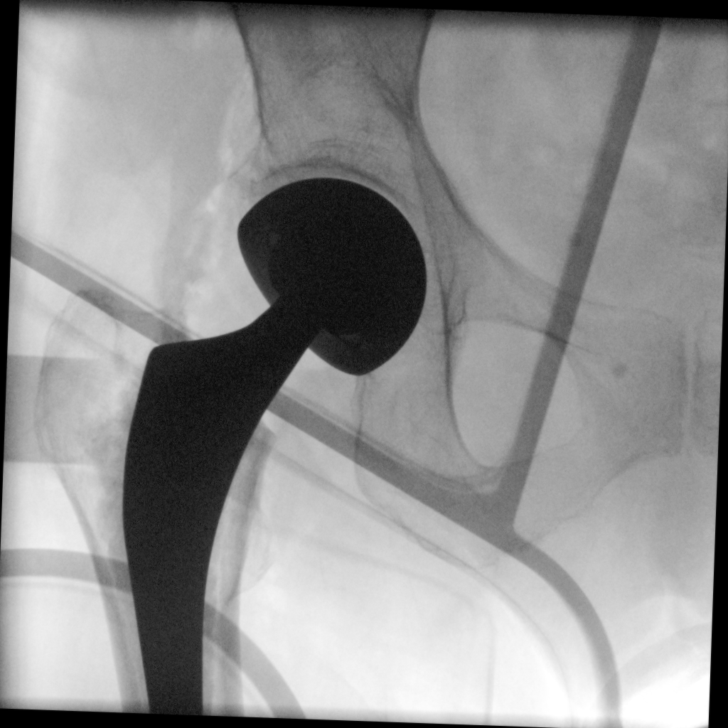

[10 of 10 positions shown; findings below may reference images not displayed]

FINDINGS: Images are performed prior to and following placement of RIGHT hip
hemiarthroplasty. The hardware appears well seated. Hip is normally
located. No interval fractures. Remote LEFT hip arthroplasty and
LEFT femur ORIF.
IMPRESSION: RIGHT hip hemiarthroplasty.
